# Patient Record
Sex: Female | Born: 1955 | Race: White | Hispanic: No | Marital: Married | Smoking: Never smoker
Health system: Southern US, Community
[De-identification: ages and names within clinical notes are randomized; demographics above are authoritative.]

## PROBLEM LIST (undated history)

## (undated) DIAGNOSIS — E785 Hyperlipidemia, unspecified: Secondary | ICD-10-CM

## (undated) DIAGNOSIS — T753XXA Motion sickness, initial encounter: Secondary | ICD-10-CM

## (undated) DIAGNOSIS — M1990 Unspecified osteoarthritis, unspecified site: Secondary | ICD-10-CM

## (undated) DIAGNOSIS — C801 Malignant (primary) neoplasm, unspecified: Secondary | ICD-10-CM

## (undated) DIAGNOSIS — Z973 Presence of spectacles and contact lenses: Secondary | ICD-10-CM

## (undated) HISTORY — PX: CHOLECYSTECTOMY: SHX55

## (undated) HISTORY — DX: Malignant (primary) neoplasm, unspecified: C80.1

## (undated) HISTORY — PX: BREAST LUMPECTOMY: SHX2

## (undated) HISTORY — PX: ABDOMINAL HYSTERECTOMY: SHX81

---

## 1997-08-07 HISTORY — PX: ABDOMINAL HYSTERECTOMY: SHX81

## 1997-08-07 HISTORY — PX: CHOLECYSTECTOMY: SHX55

## 1999-08-08 HISTORY — PX: BREAST LUMPECTOMY: SHX2

## 2000-04-02 ENCOUNTER — Other Ambulatory Visit: Admission: RE | Admit: 2000-04-02 | Discharge: 2000-04-02 | Payer: Self-pay | Admitting: Radiology

## 2000-04-10 ENCOUNTER — Encounter: Admission: RE | Admit: 2000-04-10 | Discharge: 2000-04-10 | Payer: Self-pay | Admitting: *Deleted

## 2000-04-11 ENCOUNTER — Ambulatory Visit (HOSPITAL_BASED_OUTPATIENT_CLINIC_OR_DEPARTMENT_OTHER): Admission: RE | Admit: 2000-04-11 | Discharge: 2000-04-11 | Payer: Self-pay | Admitting: *Deleted

## 2000-04-25 ENCOUNTER — Encounter: Admission: RE | Admit: 2000-04-25 | Discharge: 2000-07-24 | Payer: Self-pay | Admitting: Radiation Oncology

## 2000-05-08 ENCOUNTER — Encounter: Payer: Self-pay | Admitting: Oncology

## 2000-05-08 ENCOUNTER — Ambulatory Visit (HOSPITAL_COMMUNITY): Admission: RE | Admit: 2000-05-08 | Discharge: 2000-05-08 | Payer: Self-pay | Admitting: Oncology

## 2000-05-11 ENCOUNTER — Ambulatory Visit (HOSPITAL_BASED_OUTPATIENT_CLINIC_OR_DEPARTMENT_OTHER): Admission: RE | Admit: 2000-05-11 | Discharge: 2000-05-11 | Payer: Self-pay | Admitting: *Deleted

## 2000-06-25 ENCOUNTER — Encounter: Payer: Self-pay | Admitting: Oncology

## 2000-06-25 ENCOUNTER — Ambulatory Visit (HOSPITAL_COMMUNITY): Admission: RE | Admit: 2000-06-25 | Discharge: 2000-06-25 | Payer: Self-pay | Admitting: Oncology

## 2001-06-28 ENCOUNTER — Encounter: Admission: RE | Admit: 2001-06-28 | Discharge: 2001-06-28 | Payer: Self-pay | Admitting: Oncology

## 2001-06-28 ENCOUNTER — Encounter: Payer: Self-pay | Admitting: Oncology

## 2004-07-15 ENCOUNTER — Ambulatory Visit: Payer: Self-pay | Admitting: Podiatry

## 2004-10-05 ENCOUNTER — Ambulatory Visit: Payer: Self-pay | Admitting: Oncology

## 2005-04-11 ENCOUNTER — Ambulatory Visit: Payer: Self-pay | Admitting: Oncology

## 2005-06-15 ENCOUNTER — Ambulatory Visit: Payer: Self-pay | Admitting: Unknown Physician Specialty

## 2007-08-24 ENCOUNTER — Ambulatory Visit: Payer: Self-pay

## 2009-10-06 ENCOUNTER — Emergency Department: Payer: Self-pay | Admitting: Emergency Medicine

## 2009-11-25 ENCOUNTER — Ambulatory Visit: Payer: Self-pay | Admitting: Internal Medicine

## 2010-08-28 ENCOUNTER — Encounter: Payer: Self-pay | Admitting: Oncology

## 2010-12-12 ENCOUNTER — Ambulatory Visit (HOSPITAL_COMMUNITY): Admission: RE | Admit: 2010-12-12 | Payer: Self-pay | Source: Ambulatory Visit | Admitting: Obstetrics & Gynecology

## 2012-08-07 HISTORY — PX: COLONOSCOPY: SHX174

## 2012-08-14 ENCOUNTER — Other Ambulatory Visit: Payer: Self-pay | Admitting: Unknown Physician Specialty

## 2012-08-14 LAB — CLOSTRIDIUM DIFFICILE BY PCR

## 2012-08-16 LAB — STOOL CULTURE

## 2012-08-23 ENCOUNTER — Ambulatory Visit: Payer: Self-pay | Admitting: Unknown Physician Specialty

## 2012-08-27 LAB — PATHOLOGY REPORT

## 2012-12-26 DIAGNOSIS — C4491 Basal cell carcinoma of skin, unspecified: Secondary | ICD-10-CM

## 2012-12-26 HISTORY — DX: Basal cell carcinoma of skin, unspecified: C44.91

## 2013-02-27 LAB — PROTIME-INR
INR: 1
Prothrombin Time: 13 secs (ref 11.5–14.7)

## 2013-02-27 LAB — URINALYSIS, COMPLETE
Blood: NEGATIVE
Leukocyte Esterase: NEGATIVE
RBC,UR: NONE SEEN /HPF (ref 0–5)
Specific Gravity: 1.002 (ref 1.003–1.030)
Squamous Epithelial: NONE SEEN
WBC UR: NONE SEEN /HPF (ref 0–5)

## 2013-02-27 LAB — COMPREHENSIVE METABOLIC PANEL
Alkaline Phosphatase: 121 U/L (ref 50–136)
BUN: 13 mg/dL (ref 7–18)
Bilirubin,Total: 0.3 mg/dL (ref 0.2–1.0)
Calcium, Total: 9.6 mg/dL (ref 8.5–10.1)
Chloride: 100 mmol/L (ref 98–107)
Co2: 32 mmol/L (ref 21–32)
EGFR (Non-African Amer.): >60
Glucose: 96 mg/dL (ref 65–99)
SGOT(AST): 37 U/L (ref 15–37)
SGPT (ALT): 31 U/L (ref 12–78)
Sodium: 137 mmol/L (ref 136–145)

## 2013-02-27 LAB — CBC
HCT: 39.7 % (ref 35.0–47.0)
MCH: 29.8 pg (ref 26.0–34.0)
MCHC: 33.6 g/dL (ref 32.0–36.0)
MCV: 89 fL (ref 80–100)
Platelet: 241 10*3/uL (ref 150–440)
RBC: 4.47 10*6/uL (ref 3.80–5.20)
RDW: 12.9 % (ref 11.5–14.5)
WBC: 9.5 10*3/uL (ref 3.6–11.0)

## 2013-02-27 LAB — TROPONIN I: Troponin-I: <0.02 ng/mL

## 2013-02-27 LAB — APTT: Activated PTT: 32.7 secs (ref 23.6–35.9)

## 2013-02-28 ENCOUNTER — Observation Stay: Payer: Self-pay | Admitting: Internal Medicine

## 2013-02-28 LAB — LIPID PANEL
Cholesterol: 195 mg/dL (ref 0–200)
HDL Cholesterol: 45 mg/dL (ref 40–60)
Ldl Cholesterol, Calc: 117 mg/dL — ABNORMAL HIGH (ref 0–100)

## 2013-02-28 LAB — CK TOTAL AND CKMB (NOT AT ARMC)
CK, Total: 170 U/L (ref 21–215)
CK, Total: 121 U/L (ref 21–215)
CK, Total: 120 U/L (ref 21–215)
CK-MB: 1 ng/mL (ref 0.5–3.6)
CK-MB: 0.6 ng/mL (ref 0.5–3.6)
CK-MB: 0.6 ng/mL (ref 0.5–3.6)

## 2013-02-28 LAB — TROPONIN I: Troponin-I: <0.02 ng/mL

## 2013-02-28 LAB — TSH: Thyroid Stimulating Horm: 0.525 u[IU]/mL

## 2013-03-21 ENCOUNTER — Ambulatory Visit: Payer: Self-pay | Admitting: Otolaryngology

## 2013-05-30 ENCOUNTER — Encounter (INDEPENDENT_AMBULATORY_CARE_PROVIDER_SITE_OTHER): Payer: Self-pay

## 2013-05-30 ENCOUNTER — Ambulatory Visit (INDEPENDENT_AMBULATORY_CARE_PROVIDER_SITE_OTHER): Payer: 59 | Admitting: General Surgery

## 2013-05-30 ENCOUNTER — Other Ambulatory Visit (INDEPENDENT_AMBULATORY_CARE_PROVIDER_SITE_OTHER): Payer: Self-pay | Admitting: General Surgery

## 2013-05-30 ENCOUNTER — Encounter (INDEPENDENT_AMBULATORY_CARE_PROVIDER_SITE_OTHER): Payer: Self-pay | Admitting: General Surgery

## 2013-05-30 VITALS — BP 130/76 | HR 72 | Temp 98.0°F | Resp 16 | Ht 66.5 in | Wt 212.2 lb

## 2013-05-30 DIAGNOSIS — Q849 Congenital malformation of integument, unspecified: Secondary | ICD-10-CM

## 2013-05-30 DIAGNOSIS — Z853 Personal history of malignant neoplasm of breast: Secondary | ICD-10-CM | POA: Insufficient documentation

## 2013-05-30 DIAGNOSIS — N6489 Other specified disorders of breast: Secondary | ICD-10-CM

## 2013-05-30 DIAGNOSIS — C50912 Malignant neoplasm of unspecified site of left female breast: Secondary | ICD-10-CM

## 2013-05-30 NOTE — Progress Notes (Signed)
Patient ID: Zoe Suarez, female   DOB: 10-18-1955, 57 y.o.   MRN: [id number]  Chief Complaint  Patient presents with  . New Evaluation    eval for reconstructive sx    HPI Zoe Suarez is a 57 y.o. female.   HPI  She is self-referred. She had left breast lumpectomy and radiation therapy for left breast cancer in September of 2001. She has breast asymmetry and has increasing pain at the lumpectomy site of the left breast.  She is having difficulty finding a bra that is comfortable.  Her right breast has become more pendulous and is causing her to have back pain as well. She denies any breast masses or nipple discharge. She is interested in surgery for the asymmetry and possibly right breast reduction.  Past Medical History  Diagnosis Date  . Cancer     breast    Past Surgical History  Procedure Laterality Date  . Abdominal hysterectomy    . Cholecystectomy    . Breast lumpectomy Left     History reviewed. No pertinent family history.  Social History History  Substance Use Topics  . Smoking status: Never Smoker   . Smokeless tobacco: Never Used  . Alcohol Use: No    Allergies  Allergen Reactions  . Latex Itching  . Vicodin [Hydrocodone-Acetaminophen] Other (See Comments)    Caused sever headache    Current Outpatient Prescriptions  Medication Sig Dispense Refill  . escitalopram (LEXAPRO) 10 MG tablet       . hydrochlorothiazide (HYDRODIURIL) 25 MG tablet       . omeprazole (PRILOSEC) 20 MG capsule        No current facility-administered medications for this visit.    Review of Systems Review of Systems  Constitutional:       She has lost 30 pounds by exercising this year.  Respiratory: Negative.   Cardiovascular: Negative.     Blood pressure 130/76, pulse 72, temperature 98 F (36.7 C), temperature source Temporal, resp. rate 16, height 5' 6.5" (1.689 m), weight 212 lb 3.2 oz (96.253 kg).  Physical Exam Physical Exam  Constitutional: No distress.  Obese  female  HENT:  Head: Normocephalic and atraumatic.  Neck: Neck supple.  Pulmonary/Chest:  Right breast is large and pendulous. There are no dominant masses, suspicious skin changes, or nipple discharge.  Breast demonstrates a lower outer quadrant scar that is somewhat indented. There is a definite difference with respect a to symmetry in the lower outer quadrant of the left breast versus lower outer quadrant of the right breast. There is tenderness at the incision site. Small tattoo mark is noted in the left breast.  Musculoskeletal:  No axillary or supraclavicular adenopathy  Lymphadenopathy:    She has no cervical adenopathy.    Data Reviewed Mammogram done last week demonstrates no evidence of malignancy.  Assessment    Breast asymmetry following left lumpectomy and radiation therapy for breast cancer. Also having some increasing pain from this.     Plan    I suggest referral to a plastic surgeon to discuss options for reduction mammoplasty on the right and left breast asymmetry in the left. She is in agreement with this.        Honora Searson J 05/30/2013, 2:20 PM

## 2013-05-30 NOTE — Patient Instructions (Signed)
We will arrange a consultation with the plastic surgeon, Dr. Kelly Splinter, for you.

## 2013-06-02 ENCOUNTER — Encounter (INDEPENDENT_AMBULATORY_CARE_PROVIDER_SITE_OTHER): Payer: Self-pay

## 2013-06-02 ENCOUNTER — Telehealth (INDEPENDENT_AMBULATORY_CARE_PROVIDER_SITE_OTHER): Payer: Self-pay | Admitting: *Deleted

## 2013-06-02 NOTE — Telephone Encounter (Signed)
I spoke with pt and informed her of her appt with Dr. Kelly Splinter on 06/10/13 with an arrival time of 9:30am.  I provided pt with their address and phone number.  Pt agreeable.

## 2013-09-29 ENCOUNTER — Ambulatory Visit: Payer: Self-pay | Admitting: Podiatry

## 2014-11-27 NOTE — H&P (Signed)
PATIENT NAME:  MARIELLEN, BLANEY MR#:  [id number] DATE OF BIRTH:  Sep 09, 1955  DATE OF ADMISSION:  02/27/2013  PRIMARY CARE PHYSICIAN:  Dr. Emily Filbert.   REFERRING PHYSICIAN:  Dr. Ferman Hamming.   CHIEF COMPLAINT:  Dizziness, confusion.   HISTORY OF PRESENT ILLNESS:  Mrs. Seedorf is a 59 year old obese white female with no past medical history presented to the Emergency Department with complaints of dizziness, disorientation since this morning on and off.  The patient has been experiencing these symptoms for the last two weeks to three different occasions.  The initial episode which was 2 weeks back when patient walked on a hot day experienced severe dizziness, lightheadedness.  The second episode occurred when patient did not get her meal on time.   occurred in the office while sitting in the chair, was confused.  It was witnessed by the coworker that the patient had some slurred speech, disorientation.  Did not have any weakness in any part of the body, did not have any headache.  Did not have any nausea, vomiting.  The patient has been experiencing dizziness in change in position.  About three weeks back, the patient was noted to have increased swelling in the lower extremities.  The patient was placed on Lasix with significant improvement with swelling.  However today she noted to have more swelling in the lower extremities despite taking the Lasix.  The patient has extremely vague symptoms.  The patient also states that patient needs to eat every two hours which causes her to pass out.  However, they checked the blood sugars which usually are normal.  Does not experience any chest pain associated with  palpitations.  The patient walks on a daily basis, work-related as a realtor.  Sometimes experiences some chest pain and some mild shortness of breath.  Work-up in the Emergency Department with EKG and cardiac enzymes, other lab work is completely unremarkable.   PAST MEDICAL HISTORY:  None.   PAST  SURGICAL HISTORY:  None.   ALLERGIES:   1.  VICODIN.  2.  TAPE.   HOME MEDICATIONS: 1.  Prilosec.  2.  Lasix.   SOCIAL HISTORY:  No history of smoking, drinking alcohol or using illicit drugs.  Married, lives with her husband.   FAMILY HISTORY:  Father died of massive heart attack, mother is healthy in her 30s.   REVIEW OF SYSTEMS: CONSTITUTIONAL:  Generalized weakness, gained 80 pounds of weight in the last two years.  EYES:  No blurred vision.  EARS, NOSE, THROAT:  No tinnitus.  No ear pain.  No hearing loss.  RESPIRATORY:  No cough, shortness of breath.  CARDIOVASCULAR:  No chest pain, palpitations.  Has pedal edema.  GASTROINTESTINAL:  No nausea, vomiting, abdominal pain. GENITOURINARY:  No dysuria or hematuria.  HEMATOLOGIC:  No easy bruising or bleeding.  ENDOCRINE:  No polyuria or polydipsia.  SKIN:  No rash or lesions.  MUSCULOSKELETAL:  No joint pains and aches.  NEUROLOGIC:  No weakness or numbness in any part of the body except for the slurred speech.   PHYSICAL EXAMINATION: GENERAL:  This is a well-built, well-nourished, age-appropriate female lying down in the bed, not in distress.  VITAL SIGNS:  Temperature 98, pulse 86, blood pressure 142/67, respiratory rate of 16, oxygen saturation 97% on room air.  The patient's orthostatic blood pressures are normal.  HEENT:  Head normocephalic, atraumatic.  Eyes, no scleral icterus.  Conjunctivae normal.  Pupils equal and react to light.  Mucous membranes moist.  NECK:  Supple.  No lymphadenopathy.  No JVD.  No carotid bruit.  No thyromegaly.  CHEST:  Has no focal tenderness.  LUNGS:  Bilaterally clear to auscultation.  HEART:  S1 and S2 regular.  No murmurs are heard.  No pedal edema.  Pulses 2+.  ABDOMEN:  Bowel sounds plus.  Soft, nontender, nondistended.  No hepatosplenomegaly.  Obese abdomen.  SKIN:  No rash or lesions.  MUSCULOSKELETAL:  Good range of motion in all the extremities.  LYMPHATIC:  No axillary or inguinal  lymphadenopathy.  NEUROLOGIC:  The patient is alert, oriented to place, person and time.  Cranial nerves II through XII intact.  No motor and sensory deficits.   LABORATORY DATA:  UA negative for nitrites and leukocyte esterase.  CBC is completely within normal limits.  CMP is completely within normal limits.  Cardiac enzymes are well within normal limits.    MRI of the brain without contrast:  No acute intracranial abnormality.  Coag profile is well within normal limits.   ASSESSMENT AND PLAN:  Mrs. Purdy is a 59 year old female comes to the Emergency Department with near-syncope.  1.  Near-syncope.  Differential diagnosis, arrhythmia versus transient ischemic attack.  MRI is negative.  We will obtain echocardiogram and carotid Dopplers.  Admit the patient to the telemetry bed.  If work-up is negative, the patient could be discharged home, especially if patient's symptoms are related to change in position.  Also consider getting a tilt table test if the patient continues to have these symptoms.  2.  Obesity.  Counseled with the patient regarding diet and exercise.  3.  Dizziness if she does not eat within 2 hours' time.  We will check on the patient during the hospital stay.  If the patient is developing any hypoglycemia or irregular heartbeat or could be just psychologic.   4.  Keep the patient on DVT prophylaxis with Lovenox.   TIME SPENT:  45 minutes.     ____________________________ Monica Becton, MD pv:ea D: 02/27/2013 22:13:32 ET T: 02/27/2013 22:39:52 ET JOB#: [id number]  cc: Monica Becton, MD, <Dictator> Rusty Aus, MD Monica Becton MD ELECTRONICALLY SIGNED 03/16/2013 21:38

## 2014-11-27 NOTE — Discharge Summary (Signed)
PATIENT NAME:  Zoe Suarez, Zoe Suarez MR#:  [id number] DATE OF BIRTH:  19-Aug-1955  DATE OF ADMISSION:  02/28/2013 DATE OF DISCHARGE:  02/28/2013  DISCHARGE DIAGNOSES: 1.  Altered mental status secondary to hypoglycemia versus stress reaction.  2.  Thyroid nodule.  3.  Lower extremity edema.   DISCHARGE MEDICATIONS: 1.  Prilosec 20 mg daily.  2.  Furosemide 20 mg daily.  3.  Aspirin 81 mg daily.   REASON FOR ADMISSION:  A 59 year old female presents with altered mental status.  Please see H and P for HPI, past medical history and physical exam.   HOSPITAL COURSE:  The patient was admitted.  Symptoms gradually improved.  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  Carotid Dopplers unremarkable although did show a 3.5 cm thyroid nodule which will be followed up as an outpatient.  Echocardiogram unremarkable.  Brain MRI did not show an acute ischemia.  She does not eat on a consistent basis, thus hypoglycemia is possible.  This will be followed up as an outpatient.  No evidence for seizure disorder and I doubt any vascular event, but nonetheless, we will place her on 81 mg aspirin at this point with follow-up with Dr. Sabra Heck next week.    ____________________________ Rusty Aus, MD mfm:ea D: 02/28/2013 17:29:27 ET T: 03/01/2013 01:05:49 ET JOB#: [id number]  cc: Rusty Aus, MD, <Dictator> Marleta Lapierre Roselee Culver MD ELECTRONICALLY SIGNED 03/01/2013 9:07

## 2014-12-02 DIAGNOSIS — E041 Nontoxic single thyroid nodule: Secondary | ICD-10-CM | POA: Insufficient documentation

## 2014-12-02 DIAGNOSIS — E782 Mixed hyperlipidemia: Secondary | ICD-10-CM | POA: Insufficient documentation

## 2015-10-07 ENCOUNTER — Other Ambulatory Visit: Payer: Self-pay | Admitting: Physician Assistant

## 2015-10-07 DIAGNOSIS — R1310 Dysphagia, unspecified: Secondary | ICD-10-CM

## 2015-10-13 ENCOUNTER — Ambulatory Visit
Admission: RE | Admit: 2015-10-13 | Discharge: 2015-10-13 | Disposition: A | Discharge status: Home / Self Care | Payer: 59 | Source: Ambulatory Visit | Attending: Physician Assistant | Admitting: Physician Assistant

## 2015-10-13 DIAGNOSIS — R1310 Dysphagia, unspecified: Secondary | ICD-10-CM

## 2015-10-13 DIAGNOSIS — K449 Diaphragmatic hernia without obstruction or gangrene: Secondary | ICD-10-CM | POA: Diagnosis not present

## 2015-10-13 DIAGNOSIS — R1319 Other dysphagia: Secondary | ICD-10-CM | POA: Diagnosis present

## 2015-10-13 DIAGNOSIS — K219 Gastro-esophageal reflux disease without esophagitis: Secondary | ICD-10-CM | POA: Diagnosis not present

## 2016-09-19 DIAGNOSIS — K2100 Gastro-esophageal reflux disease with esophagitis, without bleeding: Secondary | ICD-10-CM | POA: Insufficient documentation

## 2016-10-04 ENCOUNTER — Other Ambulatory Visit (HOSPITAL_COMMUNITY): Payer: Self-pay | Admitting: General Surgery

## 2016-10-13 ENCOUNTER — Ambulatory Visit
Admission: RE | Admit: 2016-10-13 | Discharge: 2016-10-13 | Disposition: A | Discharge status: Home / Self Care | Payer: BLUE CROSS/BLUE SHIELD | Source: Ambulatory Visit | Attending: General Surgery | Admitting: General Surgery

## 2016-10-13 DIAGNOSIS — K449 Diaphragmatic hernia without obstruction or gangrene: Secondary | ICD-10-CM | POA: Insufficient documentation

## 2016-10-13 DIAGNOSIS — Z01818 Encounter for other preprocedural examination: Secondary | ICD-10-CM | POA: Diagnosis not present

## 2016-10-13 DIAGNOSIS — K219 Gastro-esophageal reflux disease without esophagitis: Secondary | ICD-10-CM | POA: Diagnosis not present

## 2016-11-21 ENCOUNTER — Encounter: Payer: BLUE CROSS/BLUE SHIELD | Attending: General Surgery | Admitting: Registered"

## 2016-11-21 ENCOUNTER — Encounter: Payer: Self-pay | Admitting: Registered"

## 2016-11-21 DIAGNOSIS — E669 Obesity, unspecified: Secondary | ICD-10-CM

## 2016-11-21 DIAGNOSIS — Z6841 Body Mass Index (BMI) 40.0 and over, adult: Secondary | ICD-10-CM | POA: Diagnosis not present

## 2016-11-21 NOTE — Patient Instructions (Addendum)
-   Practice CHEWING your food  (aim for 30 chews per bite or until applesauce consistency).  - Aim for 64-100 ounces of FLUID daily.    
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
## 2016-12-18 ENCOUNTER — Ambulatory Visit: Payer: BLUE CROSS/BLUE SHIELD

## 2017-01-22 ENCOUNTER — Ambulatory Visit: Payer: BLUE CROSS/BLUE SHIELD

## 2017-02-13 ENCOUNTER — Inpatient Hospital Stay: Admit: 2017-02-13 | Payer: BLUE CROSS/BLUE SHIELD | Admitting: General Surgery

## 2017-02-13 SURGERY — LAPAROSCOPIC ROUX-EN-Y GASTRIC BYPASS WITH UPPER ENDOSCOPY
Anesthesia: General

## 2017-02-27 ENCOUNTER — Ambulatory Visit: Payer: BLUE CROSS/BLUE SHIELD

## 2017-04-05 ENCOUNTER — Encounter (INDEPENDENT_AMBULATORY_CARE_PROVIDER_SITE_OTHER): Payer: Self-pay

## 2018-06-14 ENCOUNTER — Ambulatory Visit: Payer: Self-pay | Admitting: Podiatry

## 2018-06-28 ENCOUNTER — Encounter: Payer: Self-pay | Admitting: Podiatry

## 2018-06-28 ENCOUNTER — Encounter

## 2018-06-28 ENCOUNTER — Other Ambulatory Visit: Payer: Self-pay | Admitting: Podiatry

## 2018-06-28 ENCOUNTER — Ambulatory Visit (INDEPENDENT_AMBULATORY_CARE_PROVIDER_SITE_OTHER): Payer: BLUE CROSS/BLUE SHIELD

## 2018-06-28 ENCOUNTER — Ambulatory Visit (INDEPENDENT_AMBULATORY_CARE_PROVIDER_SITE_OTHER): Payer: BLUE CROSS/BLUE SHIELD | Admitting: Podiatry

## 2018-06-28 VITALS — BP 118/79 | HR 95 | Temp 98.2°F

## 2018-06-28 DIAGNOSIS — M2041 Other hammer toe(s) (acquired), right foot: Secondary | ICD-10-CM

## 2018-06-28 DIAGNOSIS — M722 Plantar fascial fibromatosis: Secondary | ICD-10-CM

## 2018-06-28 DIAGNOSIS — M21611 Bunion of right foot: Secondary | ICD-10-CM

## 2018-06-28 NOTE — Patient Instructions (Signed)
Pre-Operative Instructions  Congratulations, you have decided to take an important step towards improving your quality of life.  You can be assured that the doctors and staff at [facility] will be with you every step of the way.  Here are some important things you should know:  1. Plan to be at the surgery center/hospital at least 1 (one) hour prior to your scheduled time, unless otherwise directed by the surgical center/hospital staff.  You must have a responsible adult accompany you, remain during the surgery and drive you home.  Make sure you have directions to the surgical center/hospital to ensure you arrive on time. 2. If you are having surgery at [facility] or [facility], you will need a copy of your medical history and physical form from your family physician within one month prior to the date of surgery. We will give you a form for your primary physician to complete.  3. We make every effort to accommodate the date you request for surgery.  However, there are times where surgery dates or times have to be moved.  We will contact you as soon as possible if a change in schedule is required.   4. No aspirin/ibuprofen for one week before surgery.  If you are on aspirin, any non-steroidal anti-inflammatory medications (Mobic, Aleve, Ibuprofen) should not be taken seven (7) days prior to your surgery.  You make take Tylenol for pain prior to surgery.  5. Medications - If you are taking daily heart and blood pressure medications, seizure, reflux, allergy, asthma, anxiety, pain or diabetes medications, make sure you notify the surgery center/hospital before the day of surgery so they can tell you which medications you should take or avoid the day of surgery. 6. No food or drink after midnight the night before surgery unless directed otherwise by surgical center/hospital staff. 7. No alcoholic beverages 24-hours prior to surgery.  No smoking 24-hours prior or 24-hours after  surgery. 8. Wear loose pants or shorts. They should be loose enough to fit over bandages, boots, and casts. 9. Don't wear slip-on shoes. Sneakers are preferred. 10. Bring your boot with you to the surgery center/hospital.  Also bring crutches or a walker if your physician has prescribed it for you.  If you do not have this equipment, it will be provided for you after surgery. 11. If you have not been contacted by the surgery center/hospital by the day before your surgery, call to confirm the date and time of your surgery. 12. Leave-time from work may vary depending on the type of surgery you have.  Appropriate arrangements should be made prior to surgery with your employer. 13. Prescriptions will be provided immediately following surgery by your doctor.  Fill these as soon as possible after surgery and take the medication as directed. Pain medications will not be refilled on weekends and must be approved by the doctor. 14. Remove nail polish on the operative foot and avoid getting pedicures prior to surgery. 15. Wash the night before surgery.  The night before surgery wash the foot and leg well with water and the antibacterial soap provided. Be sure to pay special attention to beneath the toenails and in between the toes.  Wash for at least three (3) minutes. Rinse thoroughly with water and dry well with a towel.  Perform this wash unless told not to do so by your physician.  Enclosed: 1 Ice pack (please put in freezer the night before surgery)   1 Hibiclens skin cleaner     Pre-op instructions  If you have any questions regarding the instructions, please do not hesitate to call our office.  [place]: [address], [place], [place] [address] -- [phone]  [place]: [address] [place], [place] [address] -- [phone]  [place]: [address]  [employer], [place] [address] -- [phone]  [place]: [address], [address], [place], [place] [address] -- [phone]  Website:  [email or web address] 

## 2018-07-01 NOTE — Progress Notes (Signed)
   Subjective: 62 year old female presenting today as a new patient with a chief complaint of intermittent aching of the right hallux that began over one year ago. Walking and standing increases the pain. She has been taking Aleve with some temporary relief of the symptoms. Patient is here for further evaluation and treatment.   Past Medical History:  Diagnosis Date  . [facility])    breast     Objective: Physical Exam General: The patient is alert and oriented x3 in no acute distress.  Dermatology: Skin is cool, dry and supple bilateral lower extremities. Negative for open lesions or macerations.  Vascular: Palpable pedal pulses bilaterally. No edema or erythema noted. Capillary refill within normal limits.  Neurological: Epicritic and protective threshold grossly intact bilaterally.   Musculoskeletal Exam: Clinical evidence of bunion deformity noted to the respective foot. There is a moderate pain on palpation range of motion of the first MPJ. Lateral deviation of the hallux noted consistent with hallux abductovalgus. Hammertoe contracture also noted on clinical exam to the 2nd digit of the right foot. Symptomatic pain on palpation and range of motion also noted to the metatarsal phalangeal joints of the respective hammertoe digits.    Radiographic Exam: Increased intermetatarsal angle greater than 15 with a hallux abductus angle greater than 30 noted on AP view. Moderate degenerative changes noted within the first MPJ. Contracture deformity also noted to the interphalangeal joints and MPJs of the digits of the respective hammertoes.    Assessment: 1. HAV w/ bunion deformity right 2. Hammertoe deformity 2nd digit right    Plan of Care:  1. Patient was evaluated. X-Rays reviewed. 2. Today we discussed the conservative versus surgical management of the presenting pathology. The patient opts for surgical management. All possible complications and details of the procedure were  explained. All patient questions were answered. No guarantees were expressed or implied. 3. Authorization for surgery was initiated today. Surgery will consist of bunionectomy with metatarsal osteotomy right; DIPJ arthroplasty with MPJ capsulotomy right.  4. Return to clinic one week post op.    Edrick Kins, DPM [facility]  Dr. Edrick Kins, [place]                                        [place], [place] [address]                Office [phone]  Fax [phone]

## 2018-07-08 ENCOUNTER — Telehealth: Payer: Self-pay | Admitting: *Deleted

## 2018-07-08 NOTE — Telephone Encounter (Signed)
"  I went see Dr. Daylene Katayama about a week ago and he wants to schedule surgery in February sometime.  Could you please give me a call back?"

## 2018-07-09 NOTE — Telephone Encounter (Signed)
I'm returning your calls.  How can I help you.  "I'd like to schedule my surgery with Dr. Amalia Hailey in February."  Do you have a date in mind?  "Let's schedule it for February 13."  I will get it scheduled.  Someone from the surgical center will give you a call and give you the arrival time.  They normally call a day or two prior to your surgical date.  You need to go online and register with the surgical center, instructions are in the brochure that we gave you.  "Okay, thank you very much."

## 2018-08-14 ENCOUNTER — Telehealth: Payer: Self-pay | Admitting: *Deleted

## 2018-08-14 NOTE — Telephone Encounter (Signed)
"  Zoe Suarez said she wanted to cancel her surgery."  What date is she scheduled?  "I think February 13."  Did she say why?  "She did not but she requested her medical records so I think she's going somewhere else."  I will cancel it.

## 2018-09-19 ENCOUNTER — Ambulatory Visit: Admit: 2018-09-19 | Payer: BLUE CROSS/BLUE SHIELD | Admitting: Podiatry

## 2018-09-19 SURGERY — BUNIONECTOMY, LAPIDUS
Anesthesia: Choice | Laterality: Right

## 2018-09-20 ENCOUNTER — Emergency Department
Admission: EM | Admit: 2018-09-20 | Discharge: 2018-09-20 | Disposition: A | Discharge status: Home / Self Care | Payer: BLUE CROSS/BLUE SHIELD | Attending: Emergency Medicine | Admitting: Emergency Medicine

## 2018-09-20 ENCOUNTER — Emergency Department: Payer: BLUE CROSS/BLUE SHIELD

## 2018-09-20 ENCOUNTER — Encounter: Payer: Self-pay | Admitting: Emergency Medicine

## 2018-09-20 DIAGNOSIS — Z79899 Other long term (current) drug therapy: Secondary | ICD-10-CM | POA: Diagnosis not present

## 2018-09-20 DIAGNOSIS — R200 Anesthesia of skin: Secondary | ICD-10-CM | POA: Insufficient documentation

## 2018-09-20 DIAGNOSIS — R079 Chest pain, unspecified: Secondary | ICD-10-CM | POA: Insufficient documentation

## 2018-09-20 DIAGNOSIS — Z853 Personal history of malignant neoplasm of breast: Secondary | ICD-10-CM | POA: Diagnosis not present

## 2018-09-20 LAB — BASIC METABOLIC PANEL
ANION GAP: 6 (ref 5–15)
BUN: 15 mg/dL (ref 8–23)
CHLORIDE: 101 mmol/L (ref 98–111)
CO2: 33 mmol/L — AB (ref 22–32)
CREATININE: 0.57 mg/dL (ref 0.44–1.00)
Calcium: 9.8 mg/dL (ref 8.9–10.3)
GFR calc non Af Amer: >60 mL/min (ref >60)
Glucose, Bld: 72 mg/dL (ref 70–99)
POTASSIUM: 3 mmol/L — AB (ref 3.5–5.1)
SODIUM: 140 mmol/L (ref 135–145)

## 2018-09-20 LAB — CBC
HEMATOCRIT: 42.7 % (ref 36.0–46.0)
Hemoglobin: 13.8 g/dL (ref 12.0–15.0)
MCH: 29.9 pg (ref 26.0–34.0)
MCHC: 32.3 g/dL (ref 30.0–36.0)
MCV: 92.4 fL (ref 80.0–100.0)
NRBC: 0 % (ref 0.0–0.2)
Platelets: 248 10*3/uL (ref 150–400)
RBC: 4.62 MIL/uL (ref 3.87–5.11)
RDW: 13.4 % (ref 11.5–15.5)
WBC: 7.2 10*3/uL (ref 4.0–10.5)

## 2018-09-20 LAB — TROPONIN I: Troponin I: <0.03 ng/mL (ref <0.03)

## 2018-09-20 MED ORDER — SODIUM CHLORIDE 0.9% FLUSH
3.0000 mL | Freq: Once | INTRAVENOUS | Status: AC
  Administered 2018-09-20: 3 mL via INTRAVENOUS

## 2018-09-20 NOTE — ED Notes (Signed)
Patient transported to MRI 

## 2018-09-20 NOTE — ED Triage Notes (Signed)
Pt reports having chest tightness that began this am, states that she is also having some pain into left jaw. Under lots of stress recently. Pt does not appear to be in any obvious distress. Denies sob.

## 2018-09-20 NOTE — ED Provider Notes (Signed)
[facility] Emergency Department Provider Note   ____________________________________________   First MD Initiated Contact with Patient 09/20/18 1110     (approximate)  I have reviewed the triage vital signs and the nursing notes.   HISTORY  Chief Complaint Chest Pain    HPI Zoe Suarez is a 63 y.o. female who reports she has been under a lot of stress lately but woke up this morning at 530 and had a tight band across her chest.  She also had some numbness in the left side of her face.  She said her teeth felt funny yesterday.  Tight band lasted for about 3 hours until EMS got there.  She tried aspirin and Tums and some soda water also.  None of that helped.  Her husband reports she got very woozy and lightheaded and had to be laid down for little bit.  She almost passed out.  She is not had any of this before.  Past Medical History:  Diagnosis Date  . Cancer [facility])    breast    Patient Active Problem List   Diagnosis Date Noted  . Esophagitis, reflux 09/19/2016  . Hyperlipidemia, mixed 12/02/2014  . Thyroid nodule 12/02/2014  . Breast asymmetry 05/30/2013  . Hx of breast cancer 05/30/2013    Past Surgical History:  Procedure Laterality Date  . ABDOMINAL HYSTERECTOMY    . BREAST LUMPECTOMY Left   . CHOLECYSTECTOMY      Prior to Admission medications   Medication Sig Start Date End Date Taking? Authorizing Provider  Bioflavonoid Products (ESTER-C) [phone] MG TABS Take by mouth.   Yes [provider]  cetirizine (ZYRTEC) 10 MG tablet Take 10 mg by mouth daily.   Yes [provider]  hydrochlorothiazide (HYDRODIURIL) 25 MG tablet Take 12.5 mg by mouth daily.  03/06/13  Yes [provider]  Magnesium 400 MG CAPS Take 400 mg by mouth daily.    Yes [provider]  Multiple Vitamin (MULTIVITAMIN) tablet Take 1 tablet by mouth daily.   Yes [provider]  Omega-3 Fatty Acids (FISH OIL) 1200 MG CAPS  Take 1,200 mg by mouth daily.    Yes [provider]  omeprazole (PRILOSEC) 20 MG capsule  03/31/13  Yes [provider]  Potassium 99 MG TABS Take 99 mg by mouth daily.    Yes [provider]  vitamin B-12 (CYANOCOBALAMIN) 500 MCG tablet Take 500 mcg by mouth 2 (two) times daily.   Yes [provider]  vitamin E 400 UNIT capsule Take 400 Units by mouth daily.   Yes [provider]    Allergies Codeine; Latex; and Vicodin [hydrocodone-acetaminophen]  Family History  Problem Relation Age of Onset  . Cancer Other     Social History Social History   Tobacco Use  . Smoking status: Never Smoker  . Smokeless tobacco: Never Used  Substance Use Topics  . Alcohol use: No  . Drug use: No    Review of Systems  Constitutional: No fever/chills Eyes: No visual changes. ENT: No sore throat. Cardiovascular: Denies chest pain at present. Respiratory: Denies shortness of breath. Gastrointestinal: No abdominal pain.  No nausea, no vomiting.  No diarrhea.  No constipation. Genitourinary: Negative for dysuria. Musculoskeletal: Negative for back pain. Skin: Negative for rash. Neurological: Negative for headaches, focal weakness   ____________________________________________   PHYSICAL EXAM:  VITAL SIGNS: ED Triage Vitals [09/20/18 0933]  Enc Vitals Group     BP (!) 140/98  Pulse Rate 78     Resp 16     Temp 98.1 F (36.7 C)     Temp Source Oral     SpO2 100 %     Weight 240 lb (108.9 kg)     Height 5\' 7"  (1.702 m)     Head Circumference      Peak Flow      Pain Score 2     Pain Loc      Pain Edu?      Excl. in [place]?     Constitutional: Alert and oriented. Well appearing and in no acute distress. Eyes: Conjunctivae are normal.  Head: Atraumatic. Nose: No congestion/rhinnorhea. Mouth/Throat: Mucous membranes are moist.  Oropharynx non-erythematous. Neck: No stridor.   Cardiovascular: Normal rate, regular rhythm. Grossly normal  heart sounds.  Good peripheral circulation. Respiratory: Normal respiratory effort.  No retractions. Lungs CTAB. Gastrointestinal: Soft and nontender. No distention. No abdominal bruits. No CVA tenderness. Musculoskeletal: No lower extremity tenderness trace edema.  Neurologic:  Normal speech and language. No gross focal neurologic deficits are appreciated.    Skin:  Skin is warm, dry and intact. No rash noted. Psychiatric: Mood and affect are normal. Speech and behavior are normal.  ____________________________________________   LABS (all labs ordered are listed, but only abnormal results are displayed)  Labs Reviewed  BASIC METABOLIC PANEL - Abnormal; Notable for the following components:      Result Value   Potassium 3.0 (*)    CO2 33 (*)    All other components within normal limits  CBC  TROPONIN I  TROPONIN I   ____________________________________________  EKG  KG read interpreted by me shows normal sinus rhythm rate of 83 normal axis essentially normal EKG ____________________________________________  RADIOLOGY  ED MD interpretation: X-ray read by radiology reviewed by me shows no active disease  MRI of the head read by radiology is normal.  This was done for the face numbness that she is having.  Official radiology report(s): Dg Chest 2 View  Result Date: 09/20/2018 CLINICAL DATA:  Chest pain beginning today. Extends to the jaw and left arm. EXAM: CHEST - 2 VIEW COMPARISON:  10/13/2016 FINDINGS: Heart size is normal. Mediastinal shadows are normal. The vascularity is normal. Lungs are clear. No effusions. No abnormal bone finding. IMPRESSION: No active cardiopulmonary disease. Electronically Signed   By: Nelson Chimes M.D.   On: 09/20/2018 10:25   Mr Brain Wo Contrast  Result Date: 09/20/2018 CLINICAL DATA:  63 year old female with face numbness today. Chest tightness, pain radiating to the left jaw. EXAM: MRI HEAD WITHOUT CONTRAST TECHNIQUE: Multiplanar, multiecho  pulse sequences of the brain and surrounding structures were obtained without intravenous contrast. COMPARISON:  Brain MRI 02/27/2013. FINDINGS: Brain: No restricted diffusion to suggest acute infarction. No midline shift, mass effect, evidence of mass lesion, ventriculomegaly, extra-axial collection or acute intracranial hemorrhage. Cervicomedullary junction and pituitary are within normal limits. Pearline Cables and white matter signal is within normal limits for age throughout the brain. No chronic cerebral blood products. Vascular: Major intracranial vascular flow voids appear stable. Skull and upper cervical spine: Negative visible cervical spine. Normal bone marrow signal. Sinuses/Orbits: Stable and negative. Other: Mastoids remain clear. Scalp and face soft tissues appear negative. IMPRESSION: Normal for age noncontrast MRI appearance of the brain. Electronically Signed   By: Genevie Ann M.D.   On: 09/20/2018 12:31    ____________________________________________   PROCEDURES  Procedure(s) performed:   Procedures  Critical Care performed:   ____________________________________________  INITIAL IMPRESSION / ASSESSMENT AND PLAN / ED COURSE Second troponin is negative, EKGs are okay.  I discussed the patient in depth with Dr. Nehemiah Massed.  He is okay with letting her go as she refuses to stay in the hospital.  He will get [place] a follow-up appointment for her.  She will return if she has any further problems.          ____________________________________________   FINAL CLINICAL IMPRESSION(S) / ED DIAGNOSES  Final diagnoses:  Nonspecific chest pain     ED Discharge Orders    None       Note:  This document was prepared using Dragon voice recognition software and may include unintentional dictation errors.    Nena Polio, MD 09/20/18 [phone]

## 2018-09-20 NOTE — Discharge Instructions (Addendum)
Not sure what happened to cause her chest pain.  The EKG chest x-ray and heart blood work here today are normal.  I am still worried about what happened with you though.  Please be careful take it easy for the rest of the weekend.  Make sure you are taking full aspirin once a day.  See Dr. Nehemiah Massed the cardiologist on Monday.  They are to call shortly with an appointment time.  Return at once for any further chest tightness or episodes of wooziness.

## 2018-09-20 NOTE — ED Notes (Signed)
Sent rainbow to lab. 

## 2019-03-17 ENCOUNTER — Other Ambulatory Visit: Payer: BLUE CROSS/BLUE SHIELD

## 2019-05-12 ENCOUNTER — Other Ambulatory Visit: Payer: BLUE CROSS/BLUE SHIELD

## 2019-06-18 ENCOUNTER — Other Ambulatory Visit: Payer: Self-pay

## 2019-06-18 ENCOUNTER — Other Ambulatory Visit: Payer: Self-pay | Admitting: Podiatry

## 2019-06-18 ENCOUNTER — Encounter: Payer: Self-pay | Admitting: *Deleted

## 2019-06-23 ENCOUNTER — Other Ambulatory Visit
Admission: RE | Admit: 2019-06-23 | Discharge: 2019-06-23 | Disposition: A | Discharge status: Home / Self Care | Payer: BC Managed Care – PPO | Source: Ambulatory Visit | Attending: Podiatry | Admitting: Podiatry

## 2019-06-23 DIAGNOSIS — Z01812 Encounter for preprocedural laboratory examination: Secondary | ICD-10-CM | POA: Insufficient documentation

## 2019-06-23 DIAGNOSIS — Z20828 Contact with and (suspected) exposure to other viral communicable diseases: Secondary | ICD-10-CM | POA: Insufficient documentation

## 2019-06-23 LAB — SARS CORONAVIRUS 2 (TAT 6-24 HRS): SARS Coronavirus 2: NEGATIVE

## 2019-06-23 NOTE — Discharge Instructions (Signed)
[facility] °[facility] ° °POST OPERATIVE INSTRUCTIONS FOR DR. TROXLER, DR. FOWLER, AND DR. BAKER °[facility] ° ° °1. Take your medication as prescribed.  Pain medication should be taken only as needed. ° °2. Keep the dressing clean, dry and intact. ° °3. Keep your foot elevated above the heart level for the first 48 hours. ° °4. Walking to the bathroom and brief periods of walking are acceptable, unless we have instructed you to be non-weight bearing. ° °5. Always wear your post-op shoe when walking.  Always use your crutches if you are to be non-weight bearing. ° °6. Do not take a shower. Baths are permissible as long as the foot is kept out of the water.  ° °7. Every hour you are awake:  °- Bend your knee 15 times. °- Flex foot 15 times °- Massage calf 15 times ° °8. Call [facility] ([phone]) if any of the following problems occur: °- You develop a temperature or fever. °- The bandage becomes saturated with blood. °- Medication does not stop your pain. °- Injury of the foot occurs. °- Any symptoms of infection including redness, odor, or red streaks running from wound. ° °General Anesthesia, Adult, Care After °This sheet gives you information about how to care for yourself after your procedure. Your health care provider may also give you more specific instructions. If you have problems or questions, contact your health care provider. °What can I expect after the procedure? °After the procedure, the following side effects are common: °· Pain or discomfort at the IV site. °· Nausea. °· Vomiting. °· Sore throat. °· Trouble concentrating. °· Feeling cold or chills. °· Weak or tired. °· Sleepiness and fatigue. °· Soreness and body aches. These side effects can affect parts of the body that were not involved in surgery. °Follow these instructions at home: ° °For at least 24 hours after the procedure: °· Have a responsible adult stay with you. It is  important to have someone help care for you until you are awake and alert. °· Rest as needed. °· Do not: °? Participate in activities in which you could fall or become injured. °? Drive. °? Use heavy machinery. °? Drink alcohol. °? Take sleeping pills or medicines that cause drowsiness. °? Make important decisions or sign legal documents. °? Take care of children on your own. °Eating and drinking °· Follow any instructions from your health care provider about eating or drinking restrictions. °· When you feel hungry, start by eating small amounts of foods that are soft and easy to digest (bland), such as toast. Gradually return to your regular diet. °· Drink enough fluid to keep your urine pale yellow. °· If you vomit, rehydrate by drinking water, juice, or clear broth. °General instructions °· If you have sleep apnea, surgery and certain medicines can increase your risk for breathing problems. Follow instructions from your health care provider about wearing your sleep device: °? Anytime you are sleeping, including during daytime naps. °? While taking prescription pain medicines, sleeping medicines, or medicines that make you drowsy. °· Return to your normal activities as told by your health care provider. Ask your health care provider what activities are safe for you. °· Take over-the-counter and prescription medicines only as told by your health care provider. °· If you smoke, do not smoke without supervision. °· Keep all follow-up visits as told by your health care provider. This is important. °Contact a health care provider if: °· You   have nausea or vomiting that does not get better with medicine. °· You cannot eat or drink without vomiting. °· You have pain that does not get better with medicine. °· You are unable to pass urine. °· You develop a skin rash. °· You have a fever. °· You have redness around your IV site that gets worse. °Get help right away if: °· You have difficulty breathing. °· You have chest  pain. °· You have blood in your urine or stool, or you vomit blood. °Summary °· After the procedure, it is common to have a sore throat or nausea. It is also common to feel tired. °· Have a responsible adult stay with you for the first 24 hours after general anesthesia. It is important to have someone help care for you until you are awake and alert. °· When you feel hungry, start by eating small amounts of foods that are soft and easy to digest (bland), such as toast. Gradually return to your regular diet. °· Drink enough fluid to keep your urine pale yellow. °· Return to your normal activities as told by your health care provider. Ask your health care provider what activities are safe for you. °This information is not intended to replace advice given to you by your health care provider. Make sure you discuss any questions you have with your health care provider. °Document Released: 10/30/2000 Document Revised: 07/27/2017 Document Reviewed: 03/09/2017 °Elsevier Patient Education © 2020 [employer] ° °

## 2019-06-23 NOTE — Anesthesia Preprocedure Evaluation (Addendum)
Anesthesia Evaluation  Patient identified by MRN, date of birth, ID band Patient awake    Reviewed: Allergy & Precautions, NPO status , Patient's Chart, lab work & pertinent test results  History of Anesthesia Complications Negative for: history of anesthetic complications  Airway Mallampati: II  TM Distance: >3 FB Neck ROM: Full    Dental   Pulmonary    breath sounds clear to auscultation       Cardiovascular (-) angina(-) DOE  Rhythm:Regular Rate:Normal   HLD  Single episode of chest tightness in 09/2018. Troponins & EKG negative @ ED. Cardiology consult recommended stress test, but has not been completed   Neuro/Psych  Motion sickness    GI/Hepatic GERD  Controlled,  Endo/Other    Renal/GU      Musculoskeletal  (+) Arthritis ,   Abdominal (+) + obese (BMI 33),   Peds  Hematology   Anesthesia Other Findings Breast cancer  Reproductive/Obstetrics                            Anesthesia Physical Anesthesia Plan  ASA: II  Anesthesia Plan: General   Post-op Pain Management:  Regional for Post-op pain   Induction: Intravenous  PONV Risk Score and Plan: 3 and Ondansetron, Dexamethasone, Treatment may vary due to age or medical condition and Midazolam  Airway Management Planned: LMA  Additional Equipment:   Intra-op Plan:   Post-operative Plan: Extubation in OR  Informed Consent: I have reviewed the patients History and Physical, chart, labs and discussed the procedure including the risks, benefits and alternatives for the proposed anesthesia with the patient or authorized representative who has indicated his/her understanding and acceptance.       Plan Discussed with: CRNA and Anesthesiologist  Anesthesia Plan Comments:        Anesthesia Quick Evaluation

## 2019-06-26 ENCOUNTER — Ambulatory Visit: Payer: BC Managed Care – PPO | Admitting: Anesthesiology

## 2019-06-26 ENCOUNTER — Ambulatory Visit
Admission: RE | Admit: 2019-06-26 | Discharge: 2019-06-26 | Disposition: A | Discharge status: Home / Self Care | Payer: BC Managed Care – PPO | Attending: Podiatry | Admitting: Podiatry

## 2019-06-26 ENCOUNTER — Encounter
Admission: RE | Disposition: A | Discharge status: Home / Self Care | Payer: Self-pay | Source: Home / Self Care | Attending: Podiatry

## 2019-06-26 ENCOUNTER — Other Ambulatory Visit: Payer: Self-pay

## 2019-06-26 DIAGNOSIS — G8929 Other chronic pain: Secondary | ICD-10-CM | POA: Insufficient documentation

## 2019-06-26 DIAGNOSIS — Z79899 Other long term (current) drug therapy: Secondary | ICD-10-CM | POA: Diagnosis not present

## 2019-06-26 DIAGNOSIS — Z91040 Latex allergy status: Secondary | ICD-10-CM | POA: Diagnosis not present

## 2019-06-26 DIAGNOSIS — M2011 Hallux valgus (acquired), right foot: Secondary | ICD-10-CM | POA: Diagnosis not present

## 2019-06-26 DIAGNOSIS — Z885 Allergy status to narcotic agent status: Secondary | ICD-10-CM | POA: Diagnosis not present

## 2019-06-26 DIAGNOSIS — M899 Disorder of bone, unspecified: Secondary | ICD-10-CM | POA: Diagnosis not present

## 2019-06-26 DIAGNOSIS — M205X1 Other deformities of toe(s) (acquired), right foot: Secondary | ICD-10-CM | POA: Diagnosis not present

## 2019-06-26 DIAGNOSIS — M1990 Unspecified osteoarthritis, unspecified site: Secondary | ICD-10-CM | POA: Diagnosis not present

## 2019-06-26 DIAGNOSIS — Z853 Personal history of malignant neoplasm of breast: Secondary | ICD-10-CM | POA: Diagnosis not present

## 2019-06-26 HISTORY — DX: Hyperlipidemia, unspecified: E78.5

## 2019-06-26 HISTORY — PX: FOREIGN BODY REMOVAL: SHX962

## 2019-06-26 HISTORY — DX: Presence of spectacles and contact lenses: Z97.3

## 2019-06-26 HISTORY — PX: AIKEN OSTEOTOMY: SHX6331

## 2019-06-26 HISTORY — DX: Motion sickness, initial encounter: T75.3XXA

## 2019-06-26 HISTORY — DX: Unspecified osteoarthritis, unspecified site: M19.90

## 2019-06-26 HISTORY — PX: BUNIONECTOMY: SHX129

## 2019-06-26 SURGERY — BUNIONECTOMY
Anesthesia: General | Site: Foot | Laterality: Right

## 2019-06-26 MED ORDER — OXYCODONE HCL 5 MG/5ML PO SOLN: 5.0000 mg | Freq: Once | ORAL | Status: DC | PRN

## 2019-06-26 MED ORDER — BUPIVACAINE HCL (PF) 0.25 % IJ SOLN
INTRAMUSCULAR | Status: DC | PRN
  Administered 2019-06-26: 10 mL

## 2019-06-26 MED ORDER — HYDROMORPHONE HCL 1 MG/ML IJ SOLN: 0.2500 mg | INTRAMUSCULAR | Status: DC | PRN

## 2019-06-26 MED ORDER — ROPIVACAINE HCL 5 MG/ML IJ SOLN
INTRAMUSCULAR | Status: DC | PRN
  Administered 2019-06-26: 15 mL via EPIDURAL
  Administered 2019-06-26: 25 mL via EPIDURAL

## 2019-06-26 MED ORDER — MIDAZOLAM HCL 2 MG/2ML IJ SOLN
INTRAMUSCULAR | Status: DC | PRN
  Administered 2019-06-26: 2 mg via INTRAVENOUS

## 2019-06-26 MED ORDER — PROPOFOL 10 MG/ML IV BOLUS
INTRAVENOUS | Status: DC | PRN
  Administered 2019-06-26: 150 mg via INTRAVENOUS
  Administered 2019-06-26: 50 mg via INTRAVENOUS

## 2019-06-26 MED ORDER — DEXAMETHASONE SODIUM PHOSPHATE 4 MG/ML IJ SOLN
INTRAMUSCULAR | Status: DC | PRN
  Administered 2019-06-26: 4 mg via INTRAVENOUS

## 2019-06-26 MED ORDER — ONDANSETRON HCL 4 MG/2ML IJ SOLN
INTRAMUSCULAR | Status: DC | PRN
  Administered 2019-06-26: 4 mg via INTRAVENOUS

## 2019-06-26 MED ORDER — POVIDONE-IODINE 7.5 % EX SOLN: Freq: Once | CUTANEOUS | Status: DC

## 2019-06-26 MED ORDER — PROMETHAZINE HCL 12.5 MG PO TABS: 12.5000 mg | ORAL_TABLET | Freq: Four times a day (QID) | ORAL | 0 refills | Status: AC | PRN

## 2019-06-26 MED ORDER — GLYCOPYRROLATE 0.2 MG/ML IJ SOLN
INTRAMUSCULAR | Status: DC | PRN
  Administered 2019-06-26: 0.1 mg via INTRAVENOUS

## 2019-06-26 MED ORDER — FENTANYL CITRATE (PF) 100 MCG/2ML IJ SOLN
INTRAMUSCULAR | Status: DC | PRN
  Administered 2019-06-26: 50 ug via INTRAVENOUS

## 2019-06-26 MED ORDER — LACTATED RINGERS IV SOLN
100.0000 mL/h | INTRAVENOUS | Status: DC
  Administered 2019-06-26: 100 mL/h via INTRAVENOUS

## 2019-06-26 MED ORDER — LIDOCAINE HCL (CARDIAC) PF 100 MG/5ML IV SOSY
PREFILLED_SYRINGE | INTRAVENOUS | Status: DC | PRN
  Administered 2019-06-26: 20 mg via INTRATRACHEAL

## 2019-06-26 MED ORDER — PROMETHAZINE HCL 25 MG/ML IJ SOLN: 6.2500 mg | INTRAMUSCULAR | Status: DC | PRN

## 2019-06-26 MED ORDER — OXYCODONE HCL 5 MG PO TABS: 5.0000 mg | ORAL_TABLET | Freq: Once | ORAL | Status: DC | PRN

## 2019-06-26 MED ORDER — OXYCODONE-ACETAMINOPHEN 7.5-325 MG PO TABS: 1.0000 | ORAL_TABLET | ORAL | 0 refills | Status: AC | PRN

## 2019-06-26 MED ORDER — ENOXAPARIN SODIUM 40 MG/0.4ML ~~LOC~~ SOLN: 40.0000 mg | SUBCUTANEOUS | 0 refills | Status: AC

## 2019-06-26 MED ORDER — CEFAZOLIN SODIUM-DEXTROSE 2-4 GM/100ML-% IV SOLN
2.0000 g | INTRAVENOUS | Status: AC
  Administered 2019-06-26: 2 g via INTRAVENOUS

## 2019-06-26 SURGICAL SUPPLY — 74 items
APL SKNCLS STERI-STRIP NONHPOA (GAUZE/BANDAGES/DRESSINGS) ×1
BENZOIN TINCTURE PRP APPL 2/3 (GAUZE/BANDAGES/DRESSINGS) ×2 IMPLANT
BIT DRILL 2 FENESTRATED (MISCELLANEOUS) IMPLANT
BIT DRILL CANNULTD 2.6 X 130MM (DRILL) IMPLANT
BIT DRILL SOLID 2.0 X 110MM (DRILL) IMPLANT
BIT DRILLL 2 FENESTRATED (MISCELLANEOUS) ×1
BLADE OSC/SAGITTAL MD 5.5X18 (BLADE) IMPLANT
BLADE OSC/SAGITTAL MD 9X18.5 (BLADE) IMPLANT
BLADE OSCILLATING/SAGITTAL (BLADE) ×2
BLADE SW THK.38XMED LNG THN (BLADE) IMPLANT
BNDG CMPR 75X41 PLY HI ABS (GAUZE/BANDAGES/DRESSINGS) ×1
BNDG CMPR STD VLCR NS LF 5.8X4 (GAUZE/BANDAGES/DRESSINGS) ×2
BNDG ELASTIC 4X5.8 VLCR NS LF (GAUZE/BANDAGES/DRESSINGS) ×2 IMPLANT
BNDG ESMARK 4X12 TAN STRL LF (GAUZE/BANDAGES/DRESSINGS) ×2 IMPLANT
BNDG GAUZE 4.5X4.1 6PLY STRL (MISCELLANEOUS) ×2 IMPLANT
BNDG STRETCH 4X75 STRL LF (GAUZE/BANDAGES/DRESSINGS) ×2 IMPLANT
CANISTER SUCT 1200ML W/VALVE (MISCELLANEOUS) ×2 IMPLANT
CAST PADDING 3X4FT ST 30246 (SOFTGOODS)
COUNTERSICK 4.0 HEADED (MISCELLANEOUS) ×2
COVER LIGHT HANDLE UNIVERSAL (MISCELLANEOUS) ×4 IMPLANT
COVER PIN YLW 0.028-062 (MISCELLANEOUS) IMPLANT
CUFF TOURN SGL QUICK 18X4 (TOURNIQUET CUFF) ×1 IMPLANT
DRAPE FLUOR MINI C-ARM 54X84 (DRAPES) ×2 IMPLANT
DRILL CANNULATED 2.6 X 130MM (DRILL) ×2
DRILL SOLID 2.0 X 110MM (DRILL) ×2
DURAPREP 26ML APPLICATOR (WOUND CARE) ×2 IMPLANT
ELECT REM PT RETURN 9FT ADLT (ELECTROSURGICAL) ×2
ELECTRODE REM PT RTRN 9FT ADLT (ELECTROSURGICAL) ×1 IMPLANT
GAUZE SPONGE 4X4 12PLY STRL (GAUZE/BANDAGES/DRESSINGS) ×2 IMPLANT
GAUZE XEROFORM 1X8 LF (GAUZE/BANDAGES/DRESSINGS) ×2 IMPLANT
GLOVE BIOGEL PI IND STRL 8 (GLOVE) IMPLANT
GLOVE BIOGEL PI INDICATOR 8 (GLOVE) ×2
GLOVE SURG SS PI 7.0 STRL IVOR (GLOVE) ×1 IMPLANT
GOWN STRL REUS W/ TWL LRG LVL3 (GOWN DISPOSABLE) ×1 IMPLANT
GOWN STRL REUS W/ TWL XL LVL3 (GOWN DISPOSABLE) ×1 IMPLANT
GOWN STRL REUS W/TWL LRG LVL3 (GOWN DISPOSABLE) ×2
GOWN STRL REUS W/TWL XL LVL3 (GOWN DISPOSABLE) ×2
GUIDE WIRE 1.6 MM (WIRE) ×2
GUIDEWIRE 1.6 MM (WIRE) IMPLANT
K-WIRE DBL END TROCAR 6X.045 (WIRE)
K-WIRE DBL END TROCAR 6X.062 (WIRE)
K-WIRE SNGL END 1.2X150 (MISCELLANEOUS) ×4
KIT PROCEDURE DRILL (DRILL) ×1 IMPLANT
KIT TURNOVER KIT A (KITS) ×2 IMPLANT
KWIRE DBL END TROCAR 6X.045 (WIRE) IMPLANT
KWIRE DBL END TROCAR 6X.062 (WIRE) IMPLANT
KWIRE SNGL END 1.2X150 (MISCELLANEOUS) IMPLANT
NS IRRIG 500ML POUR BTL (IV SOLUTION) ×2 IMPLANT
PACK EXTREMITY ARMC (MISCELLANEOUS) ×2 IMPLANT
PAD CAST CTTN 3X4 STRL (SOFTGOODS) IMPLANT
PADDING CAST COTTON 3X4 STRL (SOFTGOODS)
PENCIL SMOKE EVACUATOR (MISCELLANEOUS) ×2 IMPLANT
PLATE LAPIDUS 3H STD RT (Plate) ×1 IMPLANT
RASP SM TEAR CROSS CUT (RASP) ×1 IMPLANT
SCREW 2.7 NON LOCK (Screw) ×1 IMPLANT
SCREW CANN SHT THR 4X34 (Screw) ×1 IMPLANT
SCREW COUNTERSINK 4.0 HEADED (MISCELLANEOUS) IMPLANT
SCREW LOCK PLATE R3 2.7X12 (Screw) ×1 IMPLANT
SCREW LOCK PLATE R3 2.7X14 (Screw) ×1 IMPLANT
SCREW LOCK PLATE R3 2.7X18 (Screw) ×1 IMPLANT
SPLINT CAST 1 STEP 4X30 (MISCELLANEOUS) ×2 IMPLANT
SPLINT FAST PLASTER 5X30 (CAST SUPPLIES)
SPLINT PLASTER CAST FAST 5X30 (CAST SUPPLIES) IMPLANT
STAPLE DYNACLIP 8X8 (Staple) ×1 IMPLANT
STOCKINETTE STRL 6IN 960660 (GAUZE/BANDAGES/DRESSINGS) ×2 IMPLANT
STRIP CLOSURE SKIN 1/4X4 (GAUZE/BANDAGES/DRESSINGS) ×2 IMPLANT
SUT ETHILON 5-0 FS-2 18 BLK (SUTURE) ×1 IMPLANT
SUT VIC AB 2-0 SH 27 (SUTURE)
SUT VIC AB 2-0 SH 27XBRD (SUTURE) IMPLANT
SUT VIC AB 3-0 SH 27 (SUTURE)
SUT VIC AB 3-0 SH 27X BRD (SUTURE) IMPLANT
SUT VIC AB 4-0 FS2 27 (SUTURE) ×3 IMPLANT
SUT VICRYL AB 3-0 FS1 BRD 27IN (SUTURE) IMPLANT
WIRE OLIVE SMOOTH 1.4MMX60MM (WIRE) ×4 IMPLANT

## 2019-06-26 NOTE — H&P (Signed)
H and P has been reviewed and no changes are noted.  

## 2019-06-26 NOTE — Op Note (Signed)
Operative note   Surgeon: Dr. Albertine Patricia, DPM.    Assistant: None    Preop diagnosis: 1.  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  Exostosis second toe right foot    Postop diagnosis: Same    Procedure:   1.  Lapidus hallux valgus correction with Akin osteotomies of the great toe right foot   2.  Partial phalanx removal middle phalanx right second toe       EBL: Less than 10 cc    Anesthesia:general with popliteal block general delivered by the anesthesia team preop.  I injected 0.5% Marcaine plain around the saphenous distribution in the dorsum of the foot postoperatively.    Hemostasis: Ankle tourniquet at 225 mils mercury pressure for 88 minutes    Specimen: None    Complications: None    Operative indications: Chronic pain and discomfort to the right foot unresponsive to conservative care    Procedure:  Patient was brought into the OR and placed on the operating table in thesupine position. After anesthesia was obtained theright lower extremity was prepped and draped in usual sterile fashion.  Operative Report: This time attention was directed to the first ray of the right foot where a 10 cm linear incision was made extending from the medial cuneiform all the way down to the shaft of the proximal phalanx.  This was then deepened sharp dissection bleeders clamped and bovied as required. At this time attention was directed to the first MTP joint where incision was deepened and capsule tissue was divided size longitudinally reflected away from dorsal medial plantar aspects of the metatarsal head.  The medial dorsal medial eminence of bone were noted and this was resected and rasped smoothly.  Attention then directed lateral aspect of the joint where an fibular sesamoidal ligament release and lateral capsulotomy were performed. Next attention was directed to the first metatarsocuneiform joint which was dissected medial laterally from its capsular  attachments.  The first met cuneiform joint was identified this was freed up using a combination of sagittal saw as well as an osteotome.  Once this first ray was mobile we were able to rotated and I elected to freehand cuts to remove the cartilage from both the distal portion of the medial cuneiform and the base of the first metatarsal.  These were done with a slight wedge formation.  The area was then gapped open and the portions of cartilage were removed.  There was inspected a few areas needed small amount of curettage once the area was cleaned and all bone and cartilage was removed from the previous joint space there was irrigated and the 2 joint spaces were fenestrated.  This point the first metatarsal was positioned up against the cuneiform rotated in the frontal plane and fixated with a 0.062 K wire for temporary fixation.  There is checked FluoroScan good position correction were noted.  Sesamoid position was good reduction of the intermetatarsal angle was excellent.  Next the area was fixated with another 0.062 K wire and then a 4.0 screw from the [employer] set was placed across the site going from dorsal distal lateral to proximal plantar medial.  This was seen to fixate the area nicely with good tight compression.  A straight 4-hole plate was then placed across the area with 3 locking screws and 1 nonlocking.  These were 2 7 screws.  There was an copiously irrigated throughout the entire incision margin.  At this time attention directed to the proximal phalanx which  had earlier been dissected from its periosteal layer and a wedge osteotomy was performed with apex lateral base medial.  The wedge was feathered and closed and fixated with an 8 mm bone staple.  This was checked FluoroScan also good position correction were noted.  AP and lateral view showed excellent position and fixation.  After copious irrigation the periosteum capsule tissue along length incision was closed with 4-0 Vicryl continuous  stitch as were deep superficial fascial layers.  Skin was closed with 4-0 Vicryl subcuticular stitch.  At this time attention directed to the second toe where a linear incision made over the DIP joint the extensor tendon was then divided incised transversely reflected proximally.  The head of the middle phalanx was then resected.  Tissue was then freed away from the medial base of the distal phalanx and a power rasp was used to remove a bony prominence at the base of the distal phalanx.  There was an copiously irrigated and the extensor tendon was then closed with 4-0 Vicryl simple interrupted sutures 3 different sutures were utilized.  Skin was then closed with 5-0 nylon horizontal mattress sutures.  This time a sterile compressive dressing was placed across wounds consisting of Steri-Strips Xeroform gauze 4 x 4's Kling and Kerlix.  The tourniquet was released and brought complete vascularity seen return to all digits of the left foot.   Patient tolerated the procedure and anesthesia well.  Was transported from the OR to the PACU with all vital signs stable and vascular status intact. To be discharged per routine protocol.  Will follow up in approximately 1 week in the outpatient clinic.  

## 2019-06-26 NOTE — Anesthesia Postprocedure Evaluation (Signed)
Anesthesia Post Note  Patient: Zoe Suarez  Procedure(s) Performed: LAPIDUS TYPE RIGHT (Right Foot) Barbie Banner OSTEOTOMY (Right Foot) REMOVAL bone of second toe (Right Foot)     Patient location during evaluation: PACU Anesthesia Type: General Level of consciousness: awake and alert Pain management: pain level controlled Vital Signs Assessment: post-procedure vital signs reviewed and stable Respiratory status: spontaneous breathing, nonlabored ventilation, respiratory function stable and patient connected to nasal cannula oxygen Cardiovascular status: blood pressure returned to baseline and stable Postop Assessment: no apparent nausea or vomiting Anesthetic complications: no    Aarika Moon A  [place] Asberry

## 2019-06-26 NOTE — Anesthesia Procedure Notes (Signed)
Anesthesia Regional Block: Adductor canal block   Pre-Anesthetic Checklist: ,, timeout performed, Correct Patient, Correct Site, Correct Laterality, Correct Procedure, Correct Position, site marked, Risks and benefits discussed,  Surgical consent,  Pre-op evaluation,  At surgeon's request and post-op pain management  Laterality: Right  Prep: chloraprep       Needles:  Injection technique: Single-shot  Needle Type: Echogenic Needle     Needle Length: 9cm  Needle Gauge: 21     Additional Needles:   Procedures:,,,, ultrasound used (permanent image in chart),,,,  Narrative:  Injection made incrementally with aspirations every 5 mL.  Performed by: Personally  Anesthesiologist: Quisha Mabie, Fredric Dine, MD  Additional Notes: Functioning IV was confirmed and monitors applied. Ultrasound guidance: relevant anatomy identified, needle position confirmed, local anesthetic spread visualized around nerve(s)., vascular puncture avoided.  Image printed for medical record.  Negative aspiration and no paresthesias; incremental administration of local anesthetic. The patient tolerated the procedure well. Vitals signes recorded in RN notes.

## 2019-06-26 NOTE — Anesthesia Procedure Notes (Signed)
Anesthesia Regional Block: Popliteal block   Pre-Anesthetic Checklist: ,, timeout performed, Correct Patient, Correct Site, Correct Laterality, Correct Procedure, Correct Position, site marked, Risks and benefits discussed,  Surgical consent,  Pre-op evaluation,  At surgeon's request and post-op pain management  Laterality: Right  Prep: chloraprep       Needles:  Injection technique: Single-shot  Needle Type: Echogenic Needle     Needle Length: 9cm  Needle Gauge: 21     Additional Needles:   Procedures:,,,, ultrasound used (permanent image in chart),,,,  Narrative:  Injection made incrementally with aspirations every 5 mL.  Performed by: Personally  Anesthesiologist: Sinia Antosh, Fredric Dine, MD  Additional Notes: Functioning IV was confirmed and monitors applied. Ultrasound guidance: relevant anatomy identified, needle position confirmed, local anesthetic spread visualized around nerve(s)., vascular puncture avoided.  Image printed for medical record.  Negative aspiration and no paresthesias; incremental administration of local anesthetic. The patient tolerated the procedure well. Vitals signes recorded in RN notes.

## 2019-06-26 NOTE — Transfer of Care (Signed)
Immediate Anesthesia Transfer of Care Note  Patient: Zoe Suarez  Procedure(s) Performed: LAPIDUS TYPE RIGHT (Right Foot) Barbie Banner OSTEOTOMY (Right Foot) REMOVAL bone of second toe (Right Foot)  Patient Location: PACU  Anesthesia Type: General  Level of Consciousness: awake, alert  and patient cooperative  Airway and Oxygen Therapy: Patient Spontanous Breathing and Patient connected to supplemental oxygen  Post-op Assessment: Post-op Vital signs reviewed, Patient's Cardiovascular Status Stable, Respiratory Function Stable, Patent Airway and No signs of Nausea or vomiting  Post-op Vital Signs: Reviewed and stable  Complications: No apparent anesthesia complications

## 2019-06-26 NOTE — Anesthesia Procedure Notes (Signed)
Procedure Name: LMA Insertion Date/Time: 06/26/2019 7:40 AM Performed by: Cameron Ali, CRNA Pre-anesthesia Checklist: Patient identified, Emergency Drugs available, Suction available, Timeout performed and Patient being monitored Patient Re-evaluated:Patient Re-evaluated prior to induction Oxygen Delivery Method: Circle system utilized Preoxygenation: Pre-oxygenation with 100% oxygen Induction Type: IV induction LMA: LMA inserted LMA Size: 4.0 Number of attempts: 1 Placement Confirmation: positive ETCO2 and breath sounds checked- equal and bilateral Tube secured with: Tape Dental Injury: Teeth and Oropharynx as per pre-operative assessment

## 2019-06-27 ENCOUNTER — Encounter: Payer: Self-pay | Admitting: Podiatry

## 2019-12-08 ENCOUNTER — Encounter: Payer: Self-pay | Admitting: Dermatology

## 2019-12-08 ENCOUNTER — Other Ambulatory Visit: Payer: Self-pay

## 2019-12-08 ENCOUNTER — Ambulatory Visit (INDEPENDENT_AMBULATORY_CARE_PROVIDER_SITE_OTHER): Payer: BC Managed Care – PPO | Admitting: Dermatology

## 2019-12-08 DIAGNOSIS — Z1283 Encounter for screening for malignant neoplasm of skin: Secondary | ICD-10-CM | POA: Diagnosis not present

## 2019-12-08 DIAGNOSIS — D229 Melanocytic nevi, unspecified: Secondary | ICD-10-CM

## 2019-12-08 DIAGNOSIS — L814 Other melanin hyperpigmentation: Secondary | ICD-10-CM

## 2019-12-08 DIAGNOSIS — D1801 Hemangioma of skin and subcutaneous tissue: Secondary | ICD-10-CM

## 2019-12-08 DIAGNOSIS — L570 Actinic keratosis: Secondary | ICD-10-CM | POA: Diagnosis not present

## 2019-12-08 DIAGNOSIS — Z85828 Personal history of other malignant neoplasm of skin: Secondary | ICD-10-CM

## 2019-12-08 DIAGNOSIS — L720 Epidermal cyst: Secondary | ICD-10-CM

## 2019-12-08 DIAGNOSIS — L820 Inflamed seborrheic keratosis: Secondary | ICD-10-CM

## 2019-12-08 DIAGNOSIS — D485 Neoplasm of uncertain behavior of skin: Secondary | ICD-10-CM

## 2019-12-08 DIAGNOSIS — L578 Other skin changes due to chronic exposure to nonionizing radiation: Secondary | ICD-10-CM

## 2019-12-08 DIAGNOSIS — L821 Other seborrheic keratosis: Secondary | ICD-10-CM

## 2019-12-08 NOTE — Progress Notes (Signed)
Follow-Up Visit   Subjective  Zoe Suarez is a 64 y.o. female who presents for the following: Annual Exam. Patient presents for total body skin exam for skin cancer screening and mole check. Patient here today for TBSE. Patient does have a history of BCC. There are 2 spots on either side of patient's lower back that have been present for < 1 year. Non-symptomatic.  The following portions of the chart were reviewed this encounter and updated as appropriate:  Tobacco  Allergies  Meds  Problems  Med Hx  Surg Hx  Fam Hx     Review of Systems:  No other skin or systemic complaints except as noted in HPI or Assessment and Plan.  Objective  Well appearing patient in no apparent distress; mood and affect are within normal limits.  A full examination was performed including scalp, head, eyes, ears, nose, lips, neck, chest, axillae, abdomen, back, buttocks, bilateral upper extremities, bilateral lower extremities, hands, feet, fingers, toes, fingernails, and toenails. All findings within normal limits unless otherwise noted below.  Objective  Nose x 3 (3): Erythematous thin papules/macules with gritty scale.   Objective  Right underside breast: 0.5cm cystic nodule  Objective  Left distal medial pretibial, Right buttock: Well healed scar with no evidence of recurrence.   Objective  Back x 2 (2): Erythematous keratotic or waxy stuck-on papule or plaque.   Objective  Left Breast: History of breast cancer   Assessment & Plan  AK (actinic keratosis) (3) Nose x 3  Destruction of lesion - Nose x 3 Complexity: simple   Destruction method: cryotherapy   Informed consent: discussed and consent obtained   Timeout:  patient name, date of birth, surgical site, and procedure verified Lesion destroyed using liquid nitrogen: Yes   Region frozen until ice ball extended beyond lesion: Yes   Outcome: patient tolerated procedure well with no complications   Post-procedure details:  wound care instructions given    Epidermal inclusion cyst Right underside breast  Benign, observe.    History of basal cell carcinoma (BCC) (2) Right buttock; Left distal medial pretibial  No evidence of recurrence, call clinic for new or changing lesions.   Inflamed seborrheic keratosis (2) Back x 2  Destruction of lesion - Back x 2 Complexity: simple   Destruction method: cryotherapy   Informed consent: discussed and consent obtained   Timeout:  patient name, date of birth, surgical site, and procedure verified Lesion destroyed using liquid nitrogen: Yes   Region frozen until ice ball extended beyond lesion: Yes   Outcome: patient tolerated procedure well with no complications   Post-procedure details: wound care instructions given    Neoplasm of uncertain behavior of skin Left Breast  No lymphadenopathy  Skin cancer screening   Lentigines - Scattered tan macules - Discussed due to sun exposure - Benign, observe - Call for any changes  Seborrheic Keratoses - Stuck-on, waxy, tan-brown papules and plaques  - Discussed benign etiology and prognosis. - Observe - Call for any changes  Melanocytic Nevi - Tan-brown and/or pink-flesh-colored symmetric macules and papules - Benign appearing on exam today - Observation - Call clinic for new or changing moles - Recommend daily use of broad spectrum spf 30+ sunscreen to sun-exposed areas.   Hemangiomas - Red papules - Discussed benign nature - Observe - Call for any changes  Actinic Damage - diffuse scaly erythematous macules with underlying dyspigmentation - Recommend daily broad spectrum sunscreen SPF 30+ to sun-exposed areas, reapply every 2 hours  as needed.  - Call for new or changing lesions.  Skin cancer screening performed today.  Return in about 1 year (around 12/07/2020) for TBSE.  Graciella Belton, RMA, am acting as scribe for Sarina Ser, MD .  Documentation: I have reviewed the above  documentation for accuracy and completeness, and I agree with the above.  Sarina Ser, MD

## 2019-12-08 NOTE — Patient Instructions (Addendum)
Recommend daily broad spectrum sunscreen SPF 30+ to sun-exposed areas, reapply every 2 hours as needed. Call for new or changing lesions.  Cryotherapy Aftercare  . Wash gently with soap and water everyday.   . Apply Vaseline and Band-Aid daily until healed.  

## 2019-12-12 ENCOUNTER — Other Ambulatory Visit: Payer: Self-pay | Admitting: Otolaryngology

## 2019-12-12 DIAGNOSIS — R1310 Dysphagia, unspecified: Secondary | ICD-10-CM

## 2019-12-17 ENCOUNTER — Ambulatory Visit: Admission: RE | Admit: 2019-12-17 | Payer: BC Managed Care – PPO | Source: Ambulatory Visit

## 2020-03-17 LAB — COLOGUARD

## 2020-03-17 LAB — EXTERNAL GENERIC LAB PROCEDURE

## 2020-03-26 LAB — COLOGUARD

## 2020-03-26 LAB — EXTERNAL GENERIC LAB PROCEDURE

## 2020-04-06 LAB — EXTERNAL GENERIC LAB PROCEDURE: COLOGUARD: NEGATIVE

## 2020-04-06 LAB — COLOGUARD: COLOGUARD: NEGATIVE

## 2020-09-16 ENCOUNTER — Ambulatory Visit (INDEPENDENT_AMBULATORY_CARE_PROVIDER_SITE_OTHER): Payer: BC Managed Care – PPO | Admitting: Dermatology

## 2020-09-16 ENCOUNTER — Other Ambulatory Visit: Payer: Self-pay

## 2020-09-16 DIAGNOSIS — L239 Allergic contact dermatitis, unspecified cause: Secondary | ICD-10-CM | POA: Diagnosis not present

## 2020-09-16 MED ORDER — HALCINONIDE 0.1 % EX CREA: TOPICAL_CREAM | CUTANEOUS | 2 refills | Status: AC

## 2020-09-16 NOTE — Progress Notes (Signed)
   Follow-Up Visit   Subjective  Zoe Suarez is a 65 y.o. female who presents for the following: Rash (Pt c/o rash on the neck x 4 weeks itching and irritating, ). Pt report she started otc OGX shampoo 6 months ago which she has now stopped using.   The following portions of the chart were reviewed this encounter and updated as appropriate:   Tobacco  Allergies  Meds  Problems  Med Hx  Surg Hx  Fam Hx     Review of Systems:  No other skin or systemic complaints except as noted in HPI or Assessment and Plan.  Objective  Well appearing patient in no apparent distress; mood and affect are within normal limits.  A focused examination was performed including posterior neck . Relevant physical exam findings are noted in the Assessment and Plan.  Objective  Neck - Posterior: Crust and fine flesh papules    Assessment & Plan  Allergic contact dermatitis, possibly from [place] shampoo? With crusting Neck - Posterior Start Halog cream apply to skin bid   May consider Mometasone cream if insurance will not cover Halog   Ordered Medications: Halcinonide (HALOG) 0.1 % CREA  Return if symptoms worsen or fail to improve.  IMarye Round, CMA, am acting as scribe for Sarina Ser, MD .  Documentation: I have reviewed the above documentation for accuracy and completeness, and I agree with the above.  Sarina Ser, MD

## 2020-09-19 ENCOUNTER — Encounter: Payer: Self-pay | Admitting: Dermatology

## 2020-09-20 ENCOUNTER — Telehealth: Payer: Self-pay

## 2020-09-20 ENCOUNTER — Other Ambulatory Visit: Payer: Self-pay

## 2020-09-20 DIAGNOSIS — L239 Allergic contact dermatitis, unspecified cause: Secondary | ICD-10-CM

## 2020-09-20 MED ORDER — MOMETASONE FUROATE 0.1 % EX CREA: TOPICAL_CREAM | CUTANEOUS | 0 refills | Status: AC

## 2020-09-20 NOTE — Telephone Encounter (Signed)
Halcinonide cream denied by insurance. Formulary alternatives listed are desoximetasone (except cream 0.05%), fluocinonide (except cream 0.1%), and Bryhali. Please advise.

## 2020-09-20 NOTE — Telephone Encounter (Signed)
The note indicates sending in Mometasone cream if Halog not covered. Disp 60 g 0 rf. Typically Mometasone is covered even though not in your list.  Let me know if not covered.

## 2020-09-20 NOTE — Telephone Encounter (Signed)
rx sent to pharmacy

## 2020-09-27 ENCOUNTER — Other Ambulatory Visit: Payer: Self-pay | Admitting: Radiology

## 2020-11-02 ENCOUNTER — Telehealth: Payer: Self-pay

## 2020-11-02 NOTE — Telephone Encounter (Signed)
Spoke with patient. She is going to give the medication another week before scheduling appointment.

## 2020-11-02 NOTE — Telephone Encounter (Signed)
Patient called in stating Mometasone that she has been using on her neck for a rash is not improving. Patient has been using cream since February. Patient is asking for something stronger.

## 2020-11-02 NOTE — Telephone Encounter (Signed)
IF not improving, pt may need biopsy and patch testing.   Make pt appt.

## 2020-11-09 ENCOUNTER — Ambulatory Visit (INDEPENDENT_AMBULATORY_CARE_PROVIDER_SITE_OTHER): Payer: BC Managed Care – PPO | Admitting: Dermatology

## 2020-11-09 ENCOUNTER — Encounter: Payer: Self-pay | Admitting: Dermatology

## 2020-11-09 ENCOUNTER — Other Ambulatory Visit: Payer: Self-pay

## 2020-11-09 DIAGNOSIS — D492 Neoplasm of unspecified behavior of bone, soft tissue, and skin: Secondary | ICD-10-CM

## 2020-11-09 DIAGNOSIS — D485 Neoplasm of uncertain behavior of skin: Secondary | ICD-10-CM

## 2020-11-09 DIAGNOSIS — L500 Allergic urticaria: Secondary | ICD-10-CM

## 2020-11-09 MED ORDER — PREDNISONE 20 MG PO TABS
20.0000 mg | ORAL_TABLET | Freq: Every day | ORAL | 0 refills | Status: AC
Start: 2020-11-09 — End: ?

## 2020-11-09 NOTE — Patient Instructions (Addendum)
If you have any questions or concerns for your doctor, please call our main line at [phone] and press option 4 to reach your doctor's medical assistant. If no one answers, please leave a voicemail as directed and we will return your call as soon as possible. Messages left after 4 pm will be answered the following business day.   You may also send us a message via MyChart. We typically respond to MyChart messages within 1-2 business days.  For prescription refills, please ask your pharmacy to contact our office. Our fax number is [phone].  If you have an urgent issue when the clinic is closed that cannot wait until the next business day, you can page your doctor at the number below.    Please note that while we do our best to be available for urgent issues outside of office hours, we are not available 24/7.   If you have an urgent issue and are unable to reach us, you may choose to seek medical care at your doctor's office, retail clinic, urgent care center, or emergency room.  If you have a medical emergency, please immediately call 911 or go to the emergency department.  Pager Numbers  - Dr. Kowalski: [phone]  - Dr. Moye: [phone]  - Dr. Stewart: [phone]  In the event of inclement weather, please call our main line at [phone] for an update on the status of any delays or closures.  Dermatology Medication Tips: Please keep the boxes that topical medications come in in order to help keep track of the instructions about where and how to use these. Pharmacies typically print the medication instructions only on the boxes and not directly on the medication tubes.   If your medication is too expensive, please contact our office at [phone] option 4 or send us a message through MyChart.   We are unable to tell what your co-pay for medications will be in advance as this is different depending on your insurance coverage. However, we may be able to find a  substitute medication at lower cost or fill out paperwork to get insurance to cover a needed medication.   If a prior authorization is required to get your medication covered by your insurance company, please allow us 1-2 business days to complete this process.  Drug prices often vary depending on where the prescription is filled and some pharmacies may offer cheaper prices.  The website www.goodrx.com contains coupons for medications through different pharmacies. The prices here do not account for what the cost may be with help from insurance (it may be cheaper with your insurance), but the website can give you the price if you did not use any insurance.  - You can print the associated coupon and take it with your prescription to the pharmacy.  - You may also stop by our office during regular business hours and pick up a GoodRx coupon card.  - If you need your prescription sent electronically to a different pharmacy, notify our office through [facility] MyChart or by phone at [phone] option 4.     Wound Care Instructions  1. Cleanse wound gently with soap and water once a day then pat dry with clean gauze. Apply a thing coat of Petrolatum (petroleum jelly, "Vaseline") over the wound (unless you have an allergy to this). We recommend that you use a new, sterile tube of Vaseline. Do not pick or remove scabs. Do not remove the yellow or white "healing tissue" from the base of the wound.    Cover the wound with fresh, clean, nonstick gauze and secure with paper tape. You may use Band-Aids in place of gauze and tape if the would is small enough, but would recommend trimming much of the tape off as there is often too much. Sometimes Band-Aids can irritate the skin.  3. You should call the office for your biopsy report after 1 week if you have not already been contacted.  4. If you experience any problems, such as abnormal amounts of bleeding, swelling, significant bruising, significant pain, or evidence of  infection, please call the office immediately.  5. FOR ADULT SURGERY PATIENTS: If you need something for pain relief you may take 1 extra strength Tylenol (acetaminophen) AND 2 Ibuprofen (200mg  each) together every 4 hours as needed for pain. (do not take these if you are allergic to them or if you have a reason you should not take them.) Typically, you may only need pain medication for 1 to 3 days.    Increase Zyrtec to 2 pills at night

## 2020-11-09 NOTE — Progress Notes (Signed)
   Follow-Up Visit   Subjective  Zoe Suarez is a 65 y.o. female who presents for the following: Allergic contact derm (Neck, back,chest getting worse, itchy, mometasone cr bid, otc fungal cream, zyrtec, no trouble eating meat). The following portions of the chart were reviewed this encounter and updated as appropriate:   Tobacco  Allergies  Meds  Problems  Med Hx  Surg Hx  Fam Hx     Review of Systems:  No other skin or systemic complaints except as noted in HPI or Assessment and Plan.  Objective  Well appearing patient in no apparent distress; mood and affect are within normal limits.  A focused examination was performed including back, chest. Relevant physical exam findings are noted in the Assessment and Plan.  Objective  Right Lower Back: Blanching pink eruption some scattered crust back, chest, R inframammary          Assessment & Plan  Rash - Contact dermatitis vs Eczema vs Urticaria vs other Right Lower Back Skin / nail biopsy Type of biopsy: punch   Informed consent: discussed and consent obtained   Timeout: patient name, date of birth, surgical site, and procedure verified   Procedure prep:  Patient was prepped and draped in usual sterile fashion (The patient was cleaned and prepped) Prep type:  Isopropyl alcohol Anesthesia: the lesion was anesthetized in a standard fashion   Anesthetic:  1% lidocaine w/ epinephrine 1-100,000 buffered w/ 8.4% NaHCO3 Punch size:  3.5 mm Suture size:  4-0 Suture type: nylon   Hemostasis achieved with: suture, pressure and aluminum chloride   Outcome: patient tolerated procedure well   Post-procedure details: sterile dressing applied and wound care instructions given   Dressing type: bandage, pressure dressing and bacitracin    Specimen 1 - Surgical pathology Differential Diagnosis: D48.5 Urticaria vs Eczema vs Other  Check Margins: No Blanching pink eruption some scattered crust  Urticaria vs Eczema vs  Other  Increase Zyrtec to 2 po qhs Start Prednisone 20mg  2 po x 2 days, 1 po x 5 days, then 1 po qod x 3 doses Cont Mometasone cr bid  May consider True Patch Test 36 in future. May consider Alpha Gal testing.  Risks of prednisone taper discussed including mood irritability, insomnia, weight gain, stomach ulcers, increased risk of infection, increased blood sugar (diabetes), hypertension, osteoporosis with long-term or frequent use, and rare risk of avascular necrosis of the hip.    Ordered Medications: predniSONE (DELTASONE) 20 MG tablet  Return in about 1 week (around 11/16/2020) for Bx/Suture removal f/u.   I, Othelia Pulling, RMA, am acting as scribe for Sarina Ser, MD .  Documentation: I have reviewed the above documentation for accuracy and completeness, and I agree with the above.  Sarina Ser, MD

## 2020-11-15 ENCOUNTER — Ambulatory Visit: Payer: BC Managed Care – PPO

## 2020-11-17 ENCOUNTER — Ambulatory Visit (INDEPENDENT_AMBULATORY_CARE_PROVIDER_SITE_OTHER): Payer: BC Managed Care – PPO | Admitting: Dermatology

## 2020-11-17 ENCOUNTER — Telehealth: Payer: Self-pay

## 2020-11-17 ENCOUNTER — Encounter: Payer: Self-pay | Admitting: Dermatology

## 2020-11-17 ENCOUNTER — Other Ambulatory Visit: Payer: Self-pay

## 2020-11-17 ENCOUNTER — Other Ambulatory Visit: Payer: Self-pay | Admitting: Dermatology

## 2020-11-17 DIAGNOSIS — L509 Urticaria, unspecified: Secondary | ICD-10-CM | POA: Diagnosis not present

## 2020-11-17 NOTE — Patient Instructions (Addendum)
Start over the counter Allegra 180 mg take 1 tablet daily, after 2 weeks if no better increase to Allegra 180 2 tablets daily   Cont Zyrtec tablets daily as directed

## 2020-11-17 NOTE — Progress Notes (Signed)
   Follow-Up Visit   Subjective  Zoe Suarez is a 65 y.o. female who presents for the following: Follow-up (1 week follow up biopsy proven Urticaria ). Pt here for suture removal and discussion of report and treatment options.  The following portions of the chart were reviewed this encounter and updated as appropriate:   Tobacco  Allergies  Meds  Problems  Med Hx  Surg Hx  Fam Hx     Review of Systems:  No other skin or systemic complaints except as noted in HPI or Assessment and Plan.  Objective  Well appearing patient in no apparent distress; mood and affect are within normal limits.  A focused examination was performed including face, arms,chest,back . Relevant physical exam findings are noted in the Assessment and Plan.  Objective  face, exts, tunk: Blanching pink eruption some scattered crust back, chest, R inframammary   Assessment & Plan  Urticaria face, exts, tunk Biopsy proven Urticaria discussed This condition may come and go, Urticaria could be related to stress, medications; infections; other medical conditions, etc Informational Pamphlet on Urticaria/Hives given.  Start otc Allegra 180 mg take 1 tablet daily, if no better in 1 week increase to 2 tablets daily  After 2 weeks if no better call here and we may consider starting Singulair tablets Xolair is an option in future.   Urticaria pamphlet given   Labs reviewed from 10/2020 ordered by Dr Sabra Heck:  TSH levels low- recommend Dr Sabra Heck consider repeat TSH.  Urinalysis:   Leukocyte Esterase, Urinalysis cloudy, white blood cells and bacteria.  Pt says this is normal Urinalysis for her and she is asymptomatic.  CBC normal  Lipids elevated Chemistry okay  Vitamin B-12 normal   Lab order Alpha Gal panel today to R/O Alpha-Gal IgE positivity.  Pt with no known history of tick bites.  Encounter for Removal of Sutures - Incision site at the right lower back  is clean, dry and intact - Wound cleansed,  sutures removed, wound cleansed and steri strips applied.  - Discussed pathology results showing Urticaria  - Patient advised to keep steri-strips dry until they fall off. - Scars remodel for a full year. - Once steri-strips fall off, patient can apply over-the-counter silicone scar cream each night to help with scar remodeling if desired. - Patient advised to call with any concerns or if they notice any new or changing lesions.   Pt felt faint, light headached during her office visit, check her blood pressure 3 different times  126/72 pulse 78  112/68 pulse 81 108/69 pulse  81 Recommend patient contact Dr Sanjuan Dame office to discuss light headed/faintness. Pt felt OK after about 30 minutes and wanted to go home.  Advised to call Dr Ammie Ferrier office.  She said she felt like it was from wearing a mask as she is not used to wearing a mask.  She had no chest pain or any other pain and was fine before leaving office.  Other Related Procedures Alpha-Gal Panel  Over 40 minutes spent with pt in evaluation and discussion today.  Return for as scheduled Dec 08 2020.  IMarye Round, CMA, am acting as scribe for Sarina Ser, MD .  Documentation: I have reviewed the above documentation for accuracy and completeness, and I agree with the above.  Sarina Ser, MD

## 2020-11-17 NOTE — Telephone Encounter (Signed)
Called pts PCP office Dr Sabra Heck spoke with Jenny Reichmann, discussed this patient was in the office today felt faint, light headedness  low blood pressure reading, Jenny Reichmann will forward this message to Dr Sabra Heck

## 2020-11-17 NOTE — Telephone Encounter (Signed)
Called pt to see how she was doing after feeling faint and light headed during her visit, pt report she is doing and feeling much better, discussed with pt she should see her PCP Dr Sabra Heck to inform him light headedness and blood pressure drop

## 2020-11-22 LAB — ALPHA-GAL PANEL
Allergen Lamb IgE: <0.1 kU/L
Beef IgE: <0.1 kU/L
IgE (Immunoglobulin E), Serum: 11 IU/mL (ref 6–495)
O215-IgE Alpha-Gal: <0.1 kU/L
Pork IgE: <0.1 kU/L

## 2020-11-23 ENCOUNTER — Telehealth: Payer: Self-pay

## 2020-11-23 NOTE — Telephone Encounter (Signed)
Please review info from last visit note with her. If 1 Allegra per day not enough, then she needs to take 2 Allegra per day. If that still not controlling hives, we may send in Singulair. Let me know what she is doing. thanks

## 2020-11-23 NOTE — Telephone Encounter (Signed)
Pt called and stated that she finished the prednisone last week and that the hives had gone away. She states that the hives are back and is wondering if she needs to do another round of the prednisone. Please advise.

## 2020-11-24 ENCOUNTER — Telehealth: Payer: Self-pay

## 2020-11-24 ENCOUNTER — Other Ambulatory Visit: Payer: Self-pay

## 2020-11-24 DIAGNOSIS — L509 Urticaria, unspecified: Secondary | ICD-10-CM

## 2020-11-24 MED ORDER — MONTELUKAST SODIUM 10 MG PO TABS: 10.0000 mg | ORAL_TABLET | Freq: Every day | ORAL | 1 refills | Status: AC

## 2020-11-24 NOTE — Telephone Encounter (Signed)
Left message on voicemail to return my call.  

## 2020-11-24 NOTE — Telephone Encounter (Signed)
Pt states that she has been taking 2 Allegra per day as well as 2 Zyrtec at night.   Pt also added that she is not sure if related but that she has been taking a powder collagen in the mornings and she read that it can cause a rash. She says that she is going to d/c.

## 2020-11-24 NOTE — Telephone Encounter (Signed)
I advised pt of all recommendations from Dr. Nehemiah Massed, she voiced understanding and had no concerns.  Rx sent to pharmacy

## 2020-11-24 NOTE — Telephone Encounter (Signed)
-----   Message from Ralene Bathe, MD sent at 11/24/2020 12:41 PM EDT ----- Serum IgE levels normal Alpha-Gal IgE Level as well as Beef; Pork and Lamb IgE levels all normal  Continue current recommended treatments and keep follow up appts

## 2020-11-24 NOTE — Telephone Encounter (Signed)
Have her STOP powdered collagen. Continue 2 Allegra in AM and 2 Zyrtec in PM Start Singulair 1 po in PM disp #30 with 1 rf.  Keep May appt.

## 2020-12-06 ENCOUNTER — Other Ambulatory Visit: Payer: Self-pay

## 2020-12-06 ENCOUNTER — Ambulatory Visit (INDEPENDENT_AMBULATORY_CARE_PROVIDER_SITE_OTHER): Payer: BC Managed Care – PPO | Admitting: Dermatology

## 2020-12-06 DIAGNOSIS — Z1283 Encounter for screening for malignant neoplasm of skin: Secondary | ICD-10-CM

## 2020-12-06 DIAGNOSIS — L820 Inflamed seborrheic keratosis: Secondary | ICD-10-CM

## 2020-12-06 DIAGNOSIS — Z85828 Personal history of other malignant neoplasm of skin: Secondary | ICD-10-CM | POA: Diagnosis not present

## 2020-12-06 DIAGNOSIS — L814 Other melanin hyperpigmentation: Secondary | ICD-10-CM

## 2020-12-06 DIAGNOSIS — L509 Urticaria, unspecified: Secondary | ICD-10-CM

## 2020-12-06 DIAGNOSIS — D229 Melanocytic nevi, unspecified: Secondary | ICD-10-CM

## 2020-12-06 DIAGNOSIS — L578 Other skin changes due to chronic exposure to nonionizing radiation: Secondary | ICD-10-CM | POA: Diagnosis not present

## 2020-12-06 DIAGNOSIS — L501 Idiopathic urticaria: Secondary | ICD-10-CM

## 2020-12-06 DIAGNOSIS — L821 Other seborrheic keratosis: Secondary | ICD-10-CM

## 2020-12-06 DIAGNOSIS — D1800 Hemangioma unspecified site: Secondary | ICD-10-CM

## 2020-12-06 MED ORDER — OMALIZUMAB 150 MG ~~LOC~~ SOLR: 300.0000 mg | SUBCUTANEOUS | 6 refills | Status: DC

## 2020-12-06 NOTE — Patient Instructions (Signed)

## 2020-12-06 NOTE — Progress Notes (Signed)
Follow-Up Visit   Subjective  Zoe Suarez is a 65 y.o. female who presents for the following: tbse (Patient here today for tbse. She states her main concerns is hives on back. She states she has been dealing with urticaria on back for 2 months now. Patient is taking singular at night, zyrtec, and allegra. Patient has history of aks. ).  Patient here for full body skin exam and skin cancer screening.  The following portions of the chart were reviewed this encounter and updated as appropriate:  Tobacco  Allergies  Meds  Problems  Med Hx  Surg Hx  Fam Hx     Objective  Well appearing patient in no apparent distress; mood and affect are within normal limits.  A full examination was performed including scalp, head, eyes, ears, nose, lips, neck, chest, axillae, abdomen, back, buttocks, bilateral upper extremities, bilateral lower extremities, hands, feet, fingers, toes, fingernails, and toenails. All findings within normal limits unless otherwise noted below.  Objective  back, posterior bilateral shoulders, posterior waistline, scalp: Urticarial patches and excoriated plaques   Images      Objective  Left Dorsal Hand: Erythematous keratotic or waxy stuck-on papule or plaque.   Assessment & Plan  Urticaria -biopsy-proven.  Chronic idiopathic urticaria back, posterior bilateral shoulders, posterior waistline, scalp Chronic and persistent 2 months duration Biopsy proven   At last visit had discussed that condition may come and go, Urticaria could be related to stress, medications; infections; other medical conditions, etc Informational Pamphlet on Urticaria/Hives given.  Review labs at last visit and today from 3/22 (normal) Today also reviewed Alpha Gal lab - negative discussed normal results with patient.   Patient currently taking  Allegra 180 mg taking 2 tablets daily and Singulair tablets daily.   Instructed to start Xolair 150 mg injection with approval - Inject  300 mg into skin every month.   Can start mometasone cream to help with itch  omalizumab (XOLAIR) 150 MG injection - back, posterior bilateral shoulders, posterior waistline, scalp  Other Related Medications montelukast (SINGULAIR) 10 MG tablet  Inflamed seborrheic keratosis Left Dorsal Hand Prior to procedure, discussed risks of blister formation, small wound, skin dyspigmentation, or rare scar following cryotherapy.   Destruction of lesion - Left Dorsal Hand Complexity: simple   Destruction method: cryotherapy   Informed consent: discussed and consent obtained   Timeout:  patient name, date of birth, surgical site, and procedure verified Lesion destroyed using liquid nitrogen: Yes   Region frozen until ice ball extended beyond lesion: Yes   Outcome: patient tolerated procedure well with no complications   Post-procedure details: wound care instructions given    Lentigines - Scattered tan macules - Due to sun exposure - Benign-appering, observe - Recommend daily broad spectrum sunscreen SPF 30+ to sun-exposed areas, reapply every 2 hours as needed. - Call for any changes  Seborrheic Keratoses - Stuck-on, waxy, tan-brown papules and/or plaques  - Benign-appearing - Discussed benign etiology and prognosis. - Observe - Call for any changes  Melanocytic Nevi - Tan-brown and/or pink-flesh-colored symmetric macules and papules - Benign appearing on exam today - Observation - Call clinic for new or changing moles - Recommend daily use of broad spectrum spf 30+ sunscreen to sun-exposed areas.   Hemangiomas - Red papules - Discussed benign nature - Observe - Call for any changes  Actinic Damage - Chronic condition, secondary to cumulative UV/sun exposure - diffuse scaly erythematous macules with underlying dyspigmentation - Recommend daily broad spectrum sunscreen SPF  30+ to sun-exposed areas, reapply every 2 hours as needed.  - Staying in the shade or wearing long  sleeves, sun glasses (UVA+UVB protection) and wide brim hats (4-inch brim around the entire circumference of the hat) are also recommended for sun protection.  - Call for new or changing lesions.  History of Basal Cell Carcinoma of the Skin - No evidence of recurrence today at Right buttock (2014) and Left distal medial pretibial (2017) - Recommend regular full body skin exams - Recommend daily broad spectrum sunscreen SPF 30+ to sun-exposed areas, reapply every 2 hours as needed.  - Call if any new or changing lesions are noted between office visits  Skin cancer screening performed today.  Return in about 30 days (around 01/05/2021) for urticaria follow up , 1 year tbse .  IRuthell Rummage, CMA, am acting as scribe for Sarina Ser, MD.  Documentation: I have reviewed the above documentation for accuracy and completeness, and I agree with the above.  Sarina Ser, MD

## 2020-12-08 ENCOUNTER — Encounter: Payer: BC Managed Care – PPO | Admitting: Dermatology

## 2020-12-09 ENCOUNTER — Encounter: Payer: Self-pay | Admitting: Dermatology

## 2020-12-09 MED ORDER — OMALIZUMAB 150 MG ~~LOC~~ SOLR: 300.0000 mg | SUBCUTANEOUS | 6 refills | Status: DC

## 2020-12-09 NOTE — Addendum Note (Signed)
Addended by: Ruthell Rummage A on: 12/09/2020 05:50 PM   Modules accepted: Orders

## 2020-12-14 ENCOUNTER — Telehealth: Payer: Self-pay

## 2020-12-14 NOTE — Telephone Encounter (Signed)
Xolair samples were received today. Did you want this patient to come in and get started? This is still processing through insurance at this time.

## 2020-12-14 NOTE — Telephone Encounter (Signed)
Yes - she wants to get the shot ASAP. She is leaving town Friday. Can she come in this afternoon?

## 2020-12-15 ENCOUNTER — Other Ambulatory Visit: Payer: Self-pay

## 2020-12-15 ENCOUNTER — Ambulatory Visit (INDEPENDENT_AMBULATORY_CARE_PROVIDER_SITE_OTHER): Payer: BC Managed Care – PPO | Admitting: Dermatology

## 2020-12-15 DIAGNOSIS — L509 Urticaria, unspecified: Secondary | ICD-10-CM | POA: Diagnosis not present

## 2020-12-15 MED ORDER — OMALIZUMAB 150 MG/ML ~~LOC~~ SOSY
150.0000 mg | PREFILLED_SYRINGE | Freq: Once | SUBCUTANEOUS | Status: AC
  Administered 2020-12-15: 150 mg via SUBCUTANEOUS

## 2020-12-15 NOTE — Progress Notes (Signed)
   Follow-Up Visit   Subjective  Zoe Suarez is a 65 y.o. female who presents for the following: Follow-up (Urticaria - Start Xolair injections today).  The following portions of the chart were reviewed this encounter and updated as appropriate:   Tobacco  Allergies  Meds  Problems  Med Hx  Surg Hx  Fam Hx     Review of Systems:  No other skin or systemic complaints except as noted in HPI or Assessment and Plan.  Objective  Well appearing patient in no apparent distress; mood and affect are within normal limits.  A focused examination was performed including trunk, extremities. Relevant physical exam findings are noted in the Assessment and Plan.  Objective  Trunk, extremities: Urticarial plaques   Assessment & Plan  Chronic idiopathic urticaria -severe Trunk, extremities Chronic and persistent   Continue Allegra 180 mg 2 po qd,  Singulair 10 mg 1 po qd  Xolair 150 mg/ml injections given today to bilateral thighs (patient has history of breast cancer of left breast so injection in left arm is not recommended).  B/P  12:25  139/81  12:40  141/83  12:55  132/81  Over 45 minutes spent in evaluation and treatment of the patient today.  omalizumab Arvid Right) prefilled syringe 150 mg - Trunk, extremities  Other Related Medications montelukast (SINGULAIR) 10 MG tablet omalizumab (XOLAIR) 150 MG injection  Return in about 1 month (around 01/15/2021) for Xolair injection.   I, Ashok Cordia, CMA, am acting as scribe for Sarina Ser, MD .  Documentation: I have reviewed the above documentation for accuracy and completeness, and I agree with the above.  Sarina Ser, MD

## 2020-12-15 NOTE — Telephone Encounter (Signed)
Called patient but call went straight to voicemail. Left message to return my call.

## 2020-12-15 NOTE — Telephone Encounter (Signed)
Patient advised of information and scheduled to come in today.

## 2020-12-15 NOTE — Patient Instructions (Signed)

## 2020-12-16 ENCOUNTER — Other Ambulatory Visit: Payer: Self-pay

## 2020-12-16 DIAGNOSIS — L509 Urticaria, unspecified: Secondary | ICD-10-CM

## 2020-12-16 MED ORDER — OMALIZUMAB 150 MG ~~LOC~~ SOLR: SUBCUTANEOUS | 5 refills | Status: AC

## 2020-12-16 NOTE — Progress Notes (Signed)
Rx needed clarification

## 2020-12-21 ENCOUNTER — Encounter: Payer: Self-pay | Admitting: Dermatology

## 2021-01-06 ENCOUNTER — Telehealth: Payer: Self-pay

## 2021-01-06 MED ORDER — EPINEPHRINE 0.3 MG/0.3ML IJ SOAJ: 0.3000 mg | INTRAMUSCULAR | 1 refills | Status: AC | PRN

## 2021-01-06 NOTE — Telephone Encounter (Signed)
[facility] called today after speaking with pt about her xolair injections and are requesting for the pt an rx for an epipen. OK to send?

## 2021-01-06 NOTE — Telephone Encounter (Signed)
Yes, may send Epipen disp 2 with 1 year rf

## 2021-01-06 NOTE — Addendum Note (Signed)
Addended by: Harriett Sine on: 01/06/2021 10:00 AM   Modules accepted: Orders

## 2021-01-13 ENCOUNTER — Other Ambulatory Visit: Payer: Self-pay

## 2021-01-13 ENCOUNTER — Ambulatory Visit (INDEPENDENT_AMBULATORY_CARE_PROVIDER_SITE_OTHER): Payer: BC Managed Care – PPO | Admitting: Dermatology

## 2021-01-13 DIAGNOSIS — L501 Idiopathic urticaria: Secondary | ICD-10-CM

## 2021-01-13 DIAGNOSIS — L239 Allergic contact dermatitis, unspecified cause: Secondary | ICD-10-CM

## 2021-01-13 DIAGNOSIS — L509 Urticaria, unspecified: Secondary | ICD-10-CM

## 2021-01-13 MED ORDER — OMALIZUMAB 150 MG/ML ~~LOC~~ SOSY
300.0000 mg | PREFILLED_SYRINGE | Freq: Once | SUBCUTANEOUS | Status: AC
  Administered 2021-01-13: 300 mg via SUBCUTANEOUS

## 2021-01-13 NOTE — Progress Notes (Signed)
   Follow-Up Visit   Subjective  Zoe Suarez is a 65 y.o. female who presents for the following: Follow-up (Urticaria follow up - 1 month. Xolair injections today. Xolair 12/15/2020, Allegra 180 mg 2 po qd, Singulair 10 mg 1 po qd - hives are still there).  The following portions of the chart were reviewed this encounter and updated as appropriate:   Tobacco  Allergies  Meds  Problems  Med Hx  Surg Hx  Fam Hx      Review of Systems:  No other skin or systemic complaints except as noted in HPI or Assessment and Plan.  Objective  Well appearing patient in no apparent distress; mood and affect are within normal limits.  A focused examination was performed including back, arms, legs. Relevant physical exam findings are noted in the Assessment and Plan.  Mid Back Pink patches of back   Assessment & Plan  Chronic idiopathic urticaria Mid Back Chronic and persistent  Has had labs to test for Alpha Gal in the past, which were negative. Will order labs to check for Lyme Disease.  Discussed evaluation by [facility] allergist. Patient declines today and may consider in the future.  Continue Allegra 180 mg 2 po qd, Singulair 10 mg 1 po qd   Xolair 150 mg/ml injections given today to bilateral thighs (patient has history of breast cancer of left breast so injection in left arm is not recommended).   B/P   3:20 146/87 - Before Xolair injection   3:45  151/81 - immediately after Xolair injection   4:00 160/104 - 15 minutes after Xolair injection  4:15 154/91 - 30 minutes after Xolair injection  May consider evaluation by [facility] allergist in the future.  Over 45 minutes spent in evaluation and treatment of the patient today.   Lyme Ab/Western Blot Reflex - Mid Back  omalizumab (XOLAIR) prefilled syringe 300 mg - Mid Back  Related Medications montelukast (SINGULAIR) 10 MG tablet Take 1 tablet (10 mg total) by mouth at bedtime.  omalizumab (XOLAIR) 150 MG injection Inject (2)  150 mg syringes (300 mg total) into the skin every 28 days.  Allergic dermatitis Mid Back History of latex allergy - Discussed evaluation by [facility] allergist. Patient declines today and may consider in the future.  She has an Epi Pen should she have a reaction to any latex products.  Return in about 1 month (around 02/12/2021) for xolair injection.  I, Ashok Cordia, CMA, am acting as scribe for Sarina Ser, MD .  Documentation: I have reviewed the above documentation for accuracy and completeness, and I agree with the above.  Sarina Ser, MD

## 2021-01-13 NOTE — Patient Instructions (Signed)

## 2021-01-18 ENCOUNTER — Encounter: Payer: Self-pay | Admitting: Dermatology

## 2021-01-31 ENCOUNTER — Telehealth: Payer: Self-pay

## 2021-01-31 NOTE — Telephone Encounter (Signed)
Pt called she requesting a referral to [facility] allergist, Dr Raliegh Ip had mentioned [facility] allergist at her last office visit but pt would like to go to [facility] allergist here in town.

## 2021-02-01 ENCOUNTER — Telehealth: Payer: Self-pay

## 2021-02-01 DIAGNOSIS — L309 Dermatitis, unspecified: Secondary | ICD-10-CM

## 2021-02-01 NOTE — Telephone Encounter (Signed)
Called pt to let her know Dr Raliegh Ip is okay with her going to [place] [facility], referral sent

## 2021-02-14 ENCOUNTER — Other Ambulatory Visit: Payer: Self-pay

## 2021-02-14 ENCOUNTER — Ambulatory Visit (INDEPENDENT_AMBULATORY_CARE_PROVIDER_SITE_OTHER): Payer: BC Managed Care – PPO

## 2021-02-14 DIAGNOSIS — L509 Urticaria, unspecified: Secondary | ICD-10-CM

## 2021-02-14 MED ORDER — OMALIZUMAB 150 MG/ML ~~LOC~~ SOSY
300.0000 mg | PREFILLED_SYRINGE | Freq: Once | SUBCUTANEOUS | Status: AC
  Administered 2021-02-14: 300 mg via SUBCUTANEOUS

## 2021-02-14 NOTE — Progress Notes (Signed)
Pt here for xolair injections. 150 mg injected into each thigh for a total of 300 mg.   Lot: [id number] Exp: 11/2021

## 2021-03-14 ENCOUNTER — Telehealth: Payer: Self-pay

## 2021-03-14 NOTE — Telephone Encounter (Signed)
Progress notes from [place] [facility] in chart review then under the media tab.

## 2021-03-17 ENCOUNTER — Other Ambulatory Visit: Payer: Self-pay

## 2021-03-17 ENCOUNTER — Ambulatory Visit (INDEPENDENT_AMBULATORY_CARE_PROVIDER_SITE_OTHER): Payer: BC Managed Care – PPO

## 2021-03-17 ENCOUNTER — Ambulatory Visit: Payer: BC Managed Care – PPO

## 2021-03-17 DIAGNOSIS — L509 Urticaria, unspecified: Secondary | ICD-10-CM | POA: Diagnosis not present

## 2021-03-17 MED ORDER — OMALIZUMAB 150 MG/ML ~~LOC~~ SOSY
300.0000 mg | PREFILLED_SYRINGE | Freq: Once | SUBCUTANEOUS | Status: AC
  Administered 2021-03-17: 300 mg via SUBCUTANEOUS

## 2021-03-17 NOTE — Progress Notes (Signed)
Pt here for Xolair injection. Xolair 150 mg injected into each thigh (300 mg total). Pt tolerated well.   Lot: [id number]  Exp: 11/2021  BP before injection - 134/61 Right after - 135/72 15 mins after - 132-80 30 mins after - 115/69

## 2021-04-18 ENCOUNTER — Ambulatory Visit: Payer: Medicare Other | Admitting: Dermatology

## 2021-04-18 ENCOUNTER — Other Ambulatory Visit: Payer: Self-pay

## 2021-04-18 DIAGNOSIS — L501 Idiopathic urticaria: Secondary | ICD-10-CM | POA: Diagnosis not present

## 2021-04-18 DIAGNOSIS — L853 Xerosis cutis: Secondary | ICD-10-CM

## 2021-04-18 DIAGNOSIS — Z85828 Personal history of other malignant neoplasm of skin: Secondary | ICD-10-CM | POA: Diagnosis not present

## 2021-04-18 DIAGNOSIS — L509 Urticaria, unspecified: Secondary | ICD-10-CM

## 2021-04-18 MED ORDER — OMALIZUMAB 150 MG/ML ~~LOC~~ SOSY
300.0000 mg | PREFILLED_SYRINGE | Freq: Once | SUBCUTANEOUS | Status: AC
  Administered 2021-04-18: 300 mg via SUBCUTANEOUS

## 2021-04-18 NOTE — Progress Notes (Signed)
   Follow-Up Visit   Subjective  Zoe Suarez is a 65 y.o. female who presents for the following: Follow-up (Urticaria - 1 month follow up - Xolair injections, seems to be improving).  The following portions of the chart were reviewed this encounter and updated as appropriate:   Tobacco  Allergies  Meds  Problems  Med Hx  Surg Hx  Fam Hx     Review of Systems:  No other skin or systemic complaints except as noted in HPI or Assessment and Plan.  Objective  Well appearing patient in no apparent distress; mood and affect are within normal limits.  A focused examination was performed including back, arms, legs. Relevant physical exam findings are noted in the Assessment and Plan.  Mild dermatographism and 2 excoriations of post neck  Xerosis   Assessment & Plan  Chronic idiopathic urticaria skin Chronic and persistent  Improved on Xolair, bu not to goal. Has had labs to test for Alpha Gal in the past, which were negative. Ordered labs to check for Lyme Disease, but pt has not done.  Discussed evaluation by [facility] allergist. Patient declined.may May consider in the future.  Continue Allegra 180 mg 2 po qd, Singulair 10 mg 1 po qd   Xolair 150 mg/ml injections given today thighs (patient has history of breast cancer of left breast so injection in left arm is not recommended).  At patients request, will plan to d/c Xolair after today and will restart if she flares.   B/P   9:15 150/80 - Before Xolair injection  9:30  152/85 - 15 minutes after Xolair injection   9:45 142/70 - 30 minutes after Xolair injection   Over 45 minutes spent in evaluation and treatment of the patient today.  omalizumab Arvid Right) prefilled syringe 300 mg   Related Medications montelukast (SINGULAIR) 10 MG tablet Take 1 tablet (10 mg total) by mouth at bedtime.  omalizumab (XOLAIR) 150 MG injection Inject (2) 150 mg syringes (300 mg total) into the skin every 28 days.  Xerosis  cutis Recommend Cerave cream daily  History of Basal Cell Carcinoma of the Skin - No evidence of recurrence today - Recommend regular full body skin exams - Recommend daily broad spectrum sunscreen SPF 30+ to sun-exposed areas, reapply every 2 hours as needed.  - Call if any new or changing lesions are noted between office visits  Return for Follow up as scheduled, TBSE.  I, Ashok Cordia, CMA, am acting as scribe for Sarina Ser, MD . Documentation: I have reviewed the above documentation for accuracy and completeness, and I agree with the above.  Sarina Ser, MD

## 2021-04-18 NOTE — Patient Instructions (Signed)

## 2021-04-20 ENCOUNTER — Encounter: Payer: Self-pay | Admitting: Dermatology

## 2021-10-07 ENCOUNTER — Other Ambulatory Visit: Payer: Self-pay

## 2021-10-07 ENCOUNTER — Emergency Department
Admission: EM | Admit: 2021-10-07 | Discharge: 2021-10-07 | Disposition: A | Discharge status: Home / Self Care | Payer: Medicare Other | Attending: Emergency Medicine | Admitting: Emergency Medicine

## 2021-10-07 ENCOUNTER — Encounter: Payer: Self-pay | Admitting: Emergency Medicine

## 2021-10-07 ENCOUNTER — Emergency Department: Payer: Medicare Other

## 2021-10-07 DIAGNOSIS — Z853 Personal history of malignant neoplasm of breast: Secondary | ICD-10-CM | POA: Insufficient documentation

## 2021-10-07 DIAGNOSIS — K440 Diaphragmatic hernia with obstruction, without gangrene: Secondary | ICD-10-CM | POA: Diagnosis not present

## 2021-10-07 DIAGNOSIS — K449 Diaphragmatic hernia without obstruction or gangrene: Secondary | ICD-10-CM

## 2021-10-07 DIAGNOSIS — R1010 Upper abdominal pain, unspecified: Secondary | ICD-10-CM | POA: Diagnosis present

## 2021-10-07 LAB — TROPONIN I (HIGH SENSITIVITY)
Troponin I (High Sensitivity): 5 ng/L (ref <18)
Troponin I (High Sensitivity): 5 ng/L (ref <18)

## 2021-10-07 LAB — URINALYSIS, ROUTINE W REFLEX MICROSCOPIC
Bacteria, UA: NONE SEEN
Bilirubin Urine: NEGATIVE
Glucose, UA: NEGATIVE mg/dL
Hgb urine dipstick: NEGATIVE
Ketones, ur: NEGATIVE mg/dL
Nitrite: NEGATIVE
Protein, ur: NEGATIVE mg/dL
Specific Gravity, Urine: 1.014 (ref 1.005–1.030)
pH: 6 (ref 5.0–8.0)

## 2021-10-07 LAB — CBC
HCT: 44.7 % (ref 36.0–46.0)
Hemoglobin: 14.1 g/dL (ref 12.0–15.0)
MCH: 29.9 pg (ref 26.0–34.0)
MCHC: 31.5 g/dL (ref 30.0–36.0)
MCV: 94.9 fL (ref 80.0–100.0)
Platelets: 265 10*3/uL (ref 150–400)
RBC: 4.71 MIL/uL (ref 3.87–5.11)
RDW: 13.4 % (ref 11.5–15.5)
WBC: 6.5 10*3/uL (ref 4.0–10.5)
nRBC: 0 % (ref 0.0–0.2)

## 2021-10-07 LAB — COMPREHENSIVE METABOLIC PANEL
ALT: 23 U/L (ref 0–44)
AST: 31 U/L (ref 15–41)
Albumin: 4.1 g/dL (ref 3.5–5.0)
Alkaline Phosphatase: 79 U/L (ref 38–126)
Anion gap: 5 (ref 5–15)
BUN: 12 mg/dL (ref 8–23)
CO2: 28 mmol/L (ref 22–32)
Calcium: 9.1 mg/dL (ref 8.9–10.3)
Chloride: 106 mmol/L (ref 98–111)
Creatinine, Ser: 0.7 mg/dL (ref 0.44–1.00)
GFR, Estimated: >60 mL/min (ref >60)
Glucose, Bld: 112 mg/dL — ABNORMAL HIGH (ref 70–99)
Potassium: 3.8 mmol/L (ref 3.5–5.1)
Sodium: 139 mmol/L (ref 135–145)
Total Bilirubin: 0.5 mg/dL (ref 0.3–1.2)
Total Protein: 7.7 g/dL (ref 6.5–8.1)

## 2021-10-07 LAB — LIPASE, BLOOD: Lipase: 38 U/L (ref 11–51)

## 2021-10-07 MED ORDER — PANTOPRAZOLE SODIUM 40 MG PO TBEC: 40.0000 mg | DELAYED_RELEASE_TABLET | Freq: Every day | ORAL | 0 refills | Status: AC

## 2021-10-07 MED ORDER — PANTOPRAZOLE SODIUM 40 MG IV SOLR
40.0000 mg | Freq: Once | INTRAVENOUS | Status: AC
Start: 2021-10-07 — End: 2021-10-07
  Administered 2021-10-07: 40 mg via INTRAVENOUS
  Filled 2021-10-07: qty 10

## 2021-10-07 MED ORDER — IOHEXOL 350 MG/ML SOLN
100.0000 mL | Freq: Once | INTRAVENOUS | Status: AC | PRN
  Administered 2021-10-07: 100 mL via INTRAVENOUS
  Filled 2021-10-07: qty 100

## 2021-10-07 MED ORDER — FENTANYL CITRATE PF 50 MCG/ML IJ SOSY
50.0000 ug | PREFILLED_SYRINGE | Freq: Once | INTRAMUSCULAR | Status: AC
  Administered 2021-10-07: 50 ug via INTRAVENOUS
  Filled 2021-10-07: qty 1

## 2021-10-07 MED ORDER — ONDANSETRON HCL 4 MG/2ML IJ SOLN
4.0000 mg | Freq: Once | INTRAMUSCULAR | Status: AC
Start: 2021-10-07 — End: 2021-10-07
  Administered 2021-10-07: 4 mg via INTRAVENOUS
  Filled 2021-10-07: qty 2

## 2021-10-07 NOTE — Discharge Instructions (Addendum)
stop your omeprazole and start taking the Protonix and see if this helps her symptoms and call GI for further follow-up to discuss further medical management and follow-up with surgery to discuss surgery options. ? ?Return to the ER for worsening pain, fevers or any other concerns ? ?Follow-up for blood pressure recheck with your primary care doctor ?IMPRESSION: ?1. No evidence of an aortic aneurysm or dissection. ?2. Large hiatal hernia. ?3. Diverticulosis without evidence of diverticulitis. ?4. Hepatic steatosis. ?  ?

## 2021-10-07 NOTE — ED Triage Notes (Signed)
C/O upper abdominal pain x 45 minutes.  Denies N/V.  Was at hospital having labs drawn for PCP. ? ?Sent from [facility] for eval. ? ?Patient is AAOx3.  Skin warm and dry. NAD. Posture upright and relaxed. NAD ?

## 2021-10-07 NOTE — ED Provider Notes (Signed)
? ?[facility] ?Provider Note ? ? ? Event Date/Time  ? First MD Initiated Contact with Patient 10/07/21 1040   ?  (approximate) ? ? ?History  ? ?Abdominal Pain ? ? ?HPI ? ?KERA DEACON is a 66 y.o. female with history of breast cancer now in remission, prior cholecystectomy who comes in with sudden onset severe abdominal and chest pain.  Patient reports the pain radiates from her upper abdomen into her chest.  It came on suddenly just prior to arrival.  Denies ever having this previously.  Does state that she was recently started on some medications for GERD.  Reports her blood pressure is usually not this elevated.  Denies any leg swelling.  Denies any calf tenderness, history of blood clots, estrogen use.  She denies feeling short of breath. ? ? ?Physical Exam  ? ?Triage Vital Signs: ?ED Triage Vitals  ?Enc Vitals Group  ?   BP 10/07/21 0917 (!) 161/131  ?   Pulse Rate 10/07/21 0917 63  ?   Resp 10/07/21 0917 16  ?   Temp 10/07/21 0917 99 ?F (37.2 ?C)  ?   Temp Source 10/07/21 0917 Oral  ?   SpO2 10/07/21 0917 99 %  ?   Weight 10/07/21 0915 210 lb 1.6 oz (95.3 kg)  ?   Height 10/07/21 0915 5\' 7"  (1.702 m)  ?   Head Circumference --   ?   Peak Flow --   ?   Pain Score 10/07/21 0914 9  ?   Pain Loc --   ?   Pain Edu? --   ?   Excl. in [place]? --   ? ? ?Most recent vital signs: ?Vitals:  ? 10/07/21 0917 10/07/21 1044  ?BP: (!) 161/131 (!) 150/108  ?Pulse: 63 68  ?Resp: 16 16  ?Temp: 99 ?F (37.2 ?C)   ?SpO2: 99% 99%  ? ? ? ?General: Awake, no distress.  ?CV:  Good peripheral perfusion.  ?Resp:  Normal effort.  ?Abd:  No distention. Tenderness upper abdomen. No garuding ?Other:  Good pulses radial ? ?ED Results / Procedures / Treatments  ? ?Labs ?(all labs ordered are listed, but only abnormal results are displayed) ?Labs Reviewed  ?COMPREHENSIVE METABOLIC PANEL - Abnormal; Notable for the following components:  ?    Result Value  ? Glucose, Bld 112 (*)   ? All other components within normal limits   ?URINALYSIS, ROUTINE W REFLEX MICROSCOPIC - Abnormal; Notable for the following components:  ? Color, Urine YELLOW (*)   ? APPearance HAZY (*)   ? Leukocytes,Ua TRACE (*)   ? All other components within normal limits  ?LIPASE, BLOOD  ?CBC  ?TROPONIN I (HIGH SENSITIVITY)  ? ? ? ?EKG ? ?My interpretation of EKG: ? ?Normal sinus rate of 81 without any ST elevation or T wave inversions except for lead III, normal intervals ? ?RADIOLOGY ?I have reviewed the  CT do not see any dissection-pending report.  ? ? ?PROCEDURES: ? ?Critical Care performed: No ? ?Procedures ? ? ?MEDICATIONS ORDERED IN ED: ?Medications  ?pantoprazole (PROTONIX) injection 40 mg (has no administration in time range)  ?fentaNYL (SUBLIMAZE) injection 50 mcg (has no administration in time range)  ?ondansetron (ZOFRAN) injection 4 mg (has no administration in time range)  ? ? ? ?IMPRESSION / MDM / ASSESSMENT AND PLAN / ED COURSE  ?I reviewed the triage vital signs and the nursing notes. ? ? ?Patient is a 66 year old who comes in with sudden  onset upper abdominal pain radiating to her chest without any shortness of breath.  But just feels a lot of fullness in her upper abdomen feels very tight.  Given patient significantly hypertensive will get CT dissection to evaluate for dissection as well as EKG, cardiac markers to evaluate for ACS.  She reports already having her gallbladder removed so unlikely cholecystitis.  Labs ordered evaluate for any choledocholithiasis.  Patient was given some IV fentanyl, IV Protonix, IV Zofran to help with symptoms. ? ?Lipase normal, urine without any obvious evidence of UTI and patient denies any urinary symptoms.  CMP normal.  CBC normal.  Initial troponin is negative but getting a repeat due to onset of timing. ? ?CT scan is reassuring.  Provided a copy of report.  Patient does have large hiatal hernia.  Patient was not aware of this.  Discussed with patient this could be causing her symptoms given this is exactly where  her discomfort is located at.  We will give her GI, surgery follow-up.  Patient already on over-the-counter medication but she is not sure of the dose.  We will start her on Protonix 40 mg until she can get follow-up and she return to the ER if she feels worsening symptoms.  Patient expressed understanding felt comfortable with plan ? ?Considered admission given patient came in with epigastric pain that could have been cardiac in nature but given reassuring work-up and patient reports she is now chest pain and abdominal pain-free patient feels comfortable with discharge home ? ?Patient will follow-up with her primary care doctor for blood pressure check next week  ? ?I discussed the provisional nature of ED diagnosis, the treatment so far, the ongoing plan of care, follow up appointments and return precautions with the patient and any family or support people present. They expressed understanding and agreed with the plan, discharged home. ? ? ? ? ? ?FINAL CLINICAL IMPRESSION(S) / ED DIAGNOSES  ? ?Final diagnoses:  ?Hiatal hernia  ? ? ? ?Rx / DC Orders  ? ?ED Discharge Orders   ? ?      Ordered  ?  pantoprazole (PROTONIX) 40 MG tablet  Daily       ? 10/07/21 1253  ? ?  ?  ? ?  ? ? ? ?Note:  This document was prepared using Dragon voice recognition software and may include unintentional dictation errors. ?  ?Vanessa Bolindale, MD ?10/07/21 1254 ? ?

## 2021-12-14 ENCOUNTER — Encounter: Payer: BC Managed Care – PPO | Admitting: Dermatology

## 2022-11-02 IMAGING — CT CT ANGIO CHEST-ABD-PELV FOR DISSECTION W/ AND WO/W CM
2 of 7 series · 13 of 46 positions shown, 15 images · IV contrast (APPLIED)
Comparison: None.

CLINICAL DATA: Acute aortic syndrome, abdominal pain for 45 minutes

EXAM:
CT ANGIOGRAPHY CHEST, ABDOMEN AND PELVIS
TECHNIQUE: Non-contrast CT of the chest was initially obtained.

[Series 7: axial arterial · axial · arterial · 0.71mm/px · z∈[-5,+550]mm · 10 of 217 slices shown, 12 images]
[im 16/217  soft-tissue]
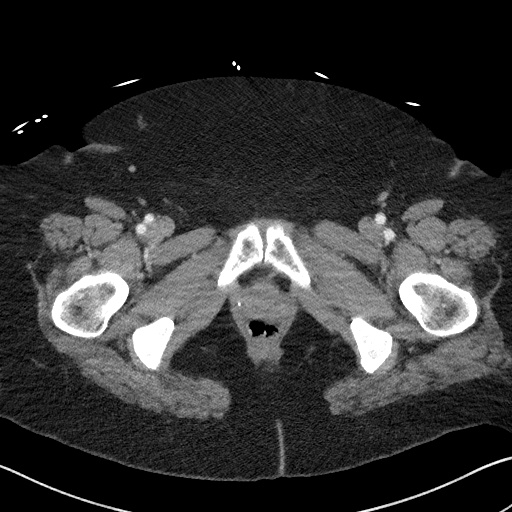
[im 16/217  bone]
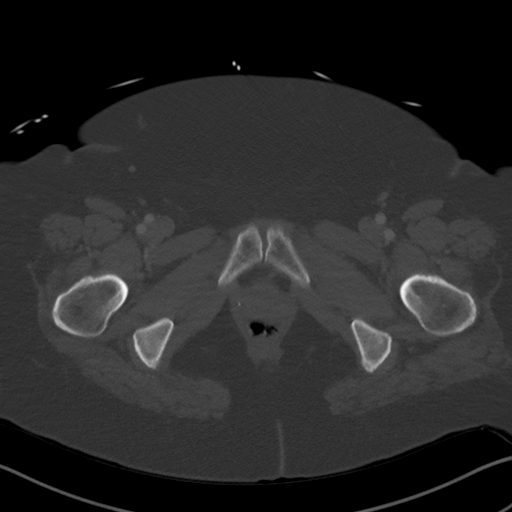
[im 31/217  soft-tissue]
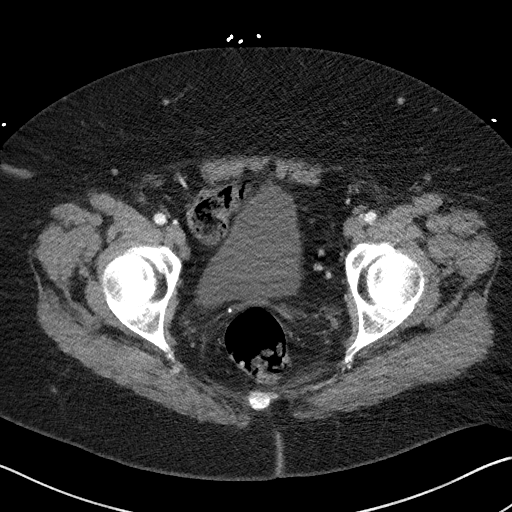
[im 62/217  soft-tissue]
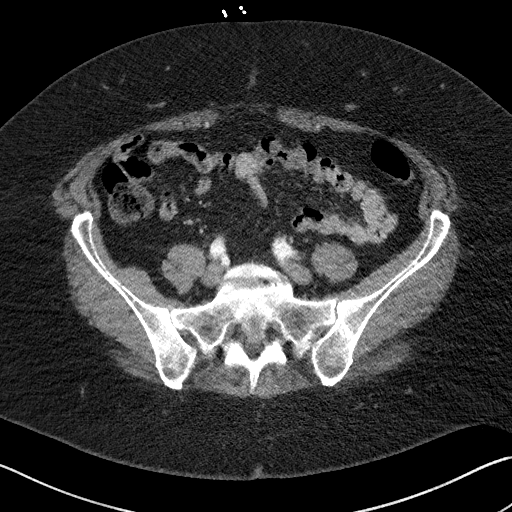
[im 78/217  soft-tissue]
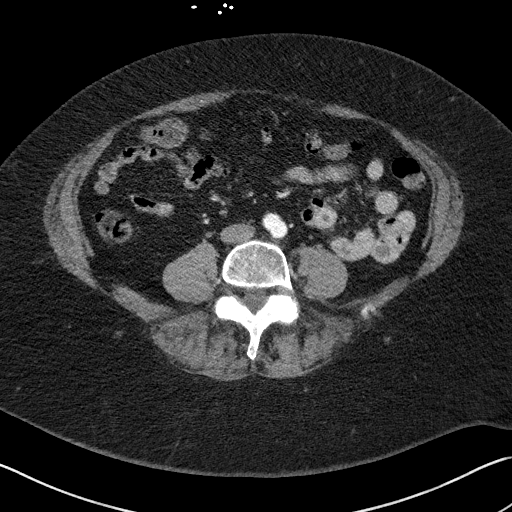
[im 93/217  soft-tissue]
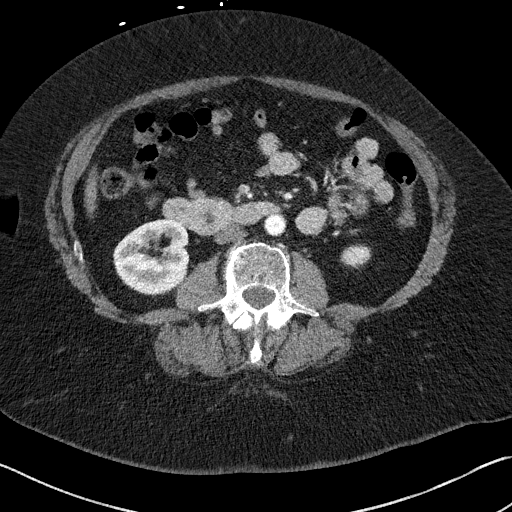
[im 124/217  soft-tissue]
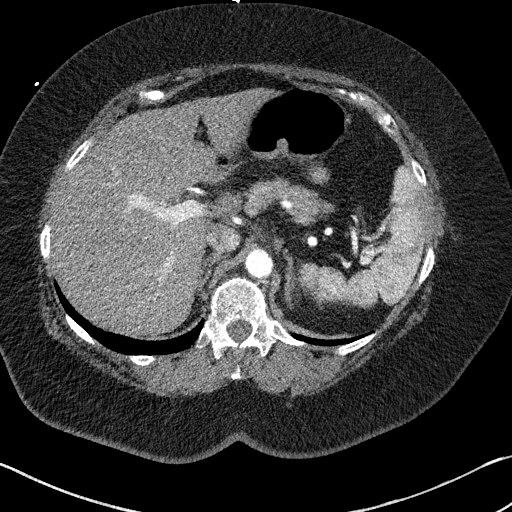
[im 139/217  soft-tissue]
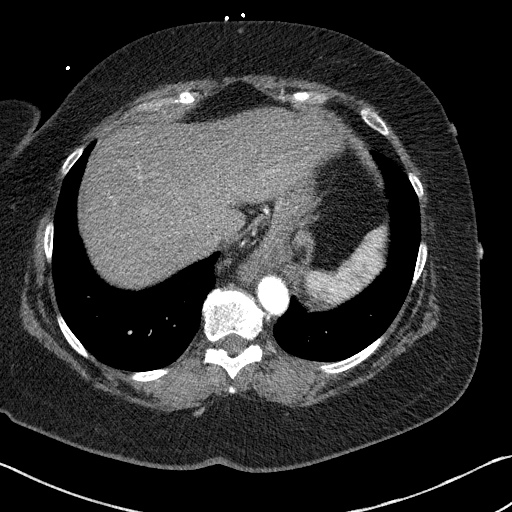
[im 155/217  soft-tissue]
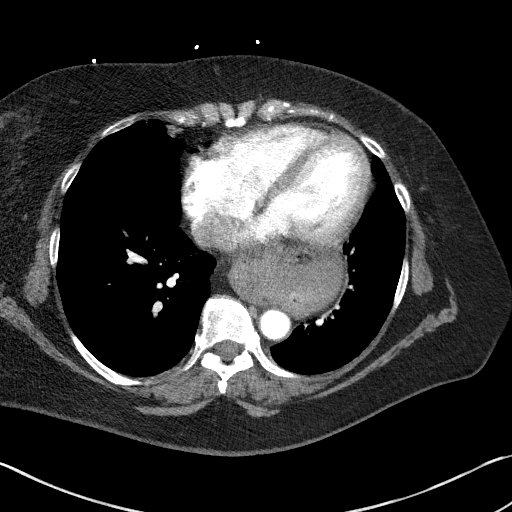
[im 186/217  soft-tissue]
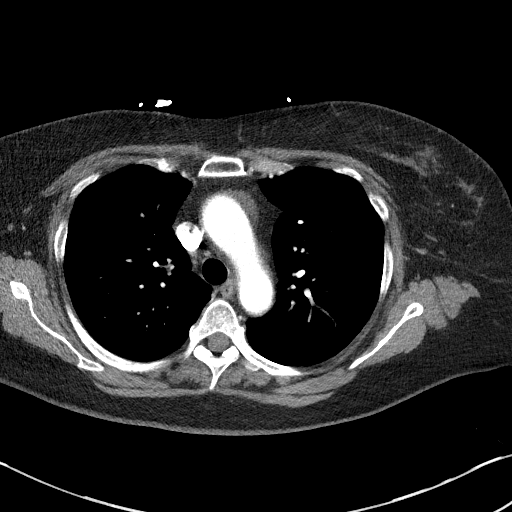
[im 186/217  bone]
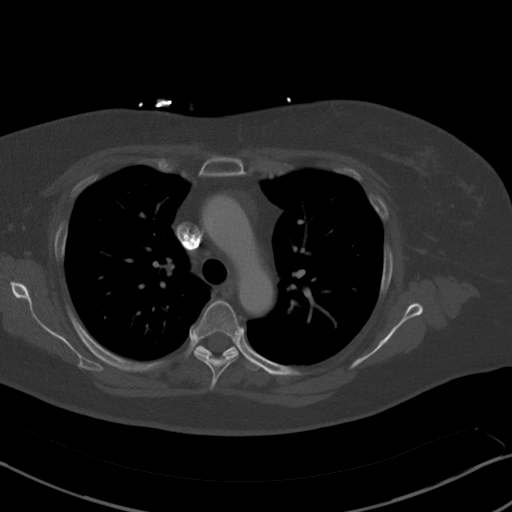
[im 201/217  soft-tissue]
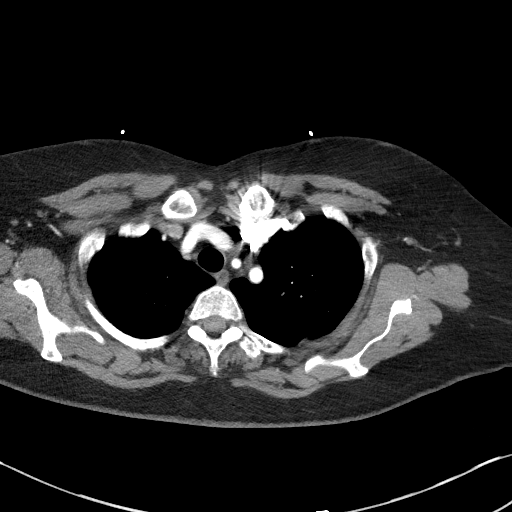

[Series 10: coronals · coronal · 0.72mm/px · 3 of 148 slices shown]
[im 37/148  soft-tissue]
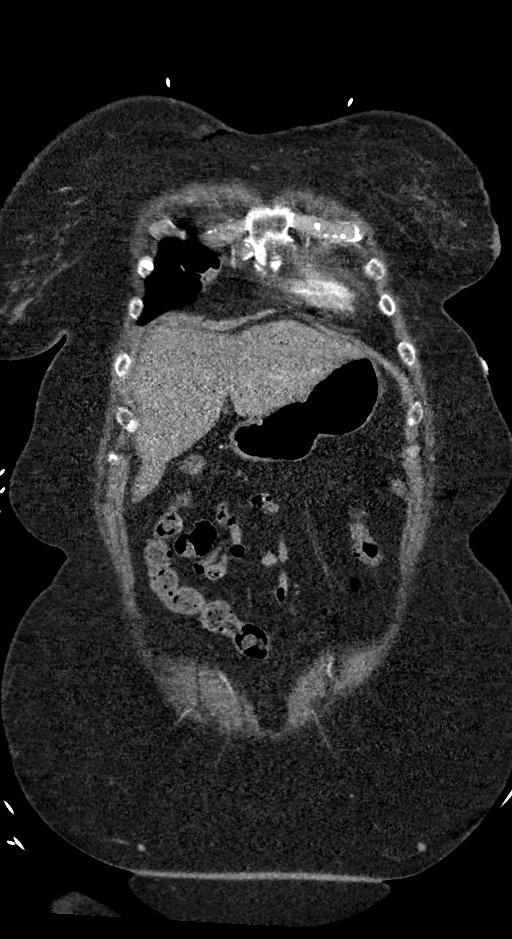
[im 74/148  soft-tissue]
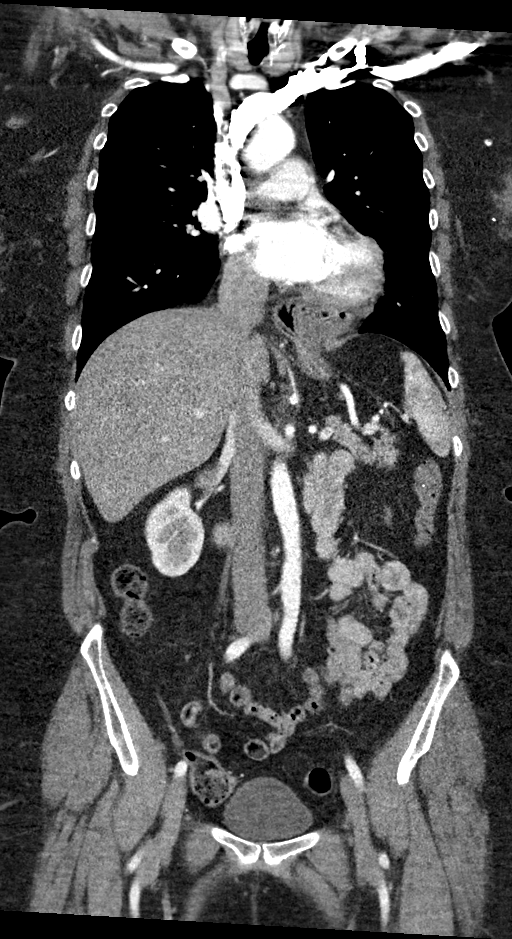
[im 111/148  soft-tissue]
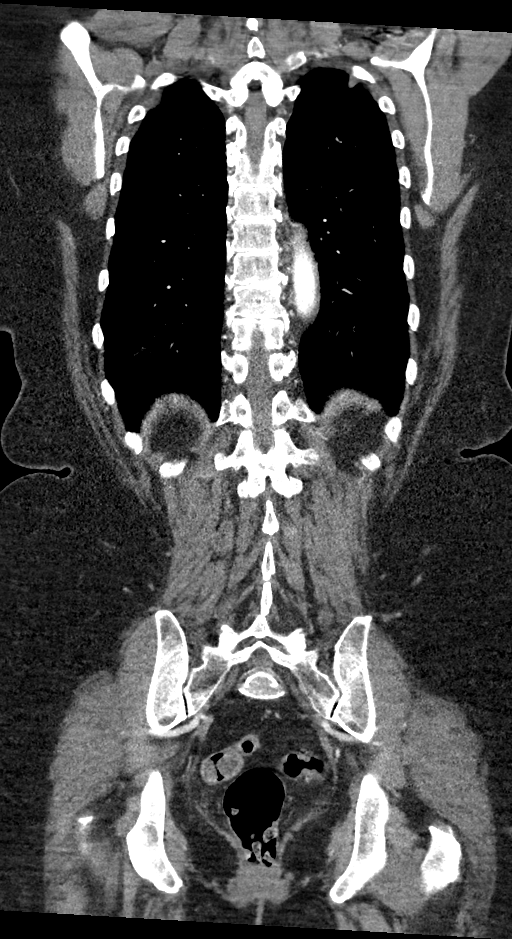

[13 of 46 positions shown; findings below may reference images not displayed]

Multidetector CT imaging through the chest, abdomen and pelvis was
performed using the standard protocol during bolus administration of
intravenous contrast. Multiplanar reconstructed images and MIPs were
obtained and reviewed to evaluate the vascular anatomy.

RADIATION DOSE REDUCTION: This exam was performed according to the
departmental dose-optimization program which includes automated
exposure control, adjustment of the mA and/or kV according to
patient size and/or use of iterative reconstruction technique.

CONTRAST:  100mL OMNIPAQUE IOHEXOL 350 MG/ML SOLN
FINDINGS: CTA CHEST FINDINGS

Cardiovascular: Preferential opacification of the thoracic aorta. No
evidence of thoracic aortic aneurysm or dissection. Normal heart
size. No pericardial effusion.

Mediastinum/Nodes: No enlarged mediastinal, hilar, or axillary lymph
nodes. Thyroid gland, trachea, and esophagus demonstrate no
significant findings.

Lungs/Pleura: Lungs are clear. No pleural effusion or pneumothorax.

Musculoskeletal: No chest wall abnormality. No acute or significant
osseous findings.

Review of the MIP images confirms the above findings.

CTA ABDOMEN AND PELVIS FINDINGS

VASCULAR

Aorta: Normal caliber aorta without aneurysm, dissection, vasculitis
or significant stenosis.

Celiac: Patent without evidence of aneurysm, dissection, vasculitis
or significant stenosis.

SMA: Patent without evidence of aneurysm, dissection, vasculitis or
significant stenosis.

Renals: Both renal arteries are patent without evidence of aneurysm,
dissection, vasculitis, fibromuscular dysplasia or significant
stenosis.

IMA: Patent without evidence of aneurysm, dissection, vasculitis or
significant stenosis.

Inflow: Patent without evidence of aneurysm, dissection, vasculitis
or significant stenosis.

Veins: No obvious venous abnormality within the limitations of this
arterial phase study.

Review of the MIP images confirms the above findings.

NON-VASCULAR

Hepatobiliary: No focal liver abnormality is seen. Low-attenuation
of the liver as can be seen with hepatic steatosis. Prior
cholecystectomy. No intrahepatic or extrahepatic biliary ductal
dilatation.

Pancreas: Unremarkable. No pancreatic ductal dilatation or
surrounding inflammatory changes.

Spleen: Normal in size without focal abnormality.

Adrenals/Urinary Tract: Adrenal glands are unremarkable. Kidneys are
normal, without renal calculi, focal lesion, or hydronephrosis.
Bladder is unremarkable.

Stomach/Bowel: Large hiatal hernia. Stomach is otherwise within
normal limits. No evidence of bowel wall thickening, distention, or
inflammatory changes. Diverticulosis without evidence of
diverticulitis.

Vascular/Lymphatic: No significant vascular findings are present. No
enlarged abdominal or pelvic lymph nodes.

Reproductive: Status post hysterectomy. No adnexal masses.

Other: No abdominal wall hernia or abnormality. No abdominopelvic
ascites.

Musculoskeletal: No acute osseous abnormality. No aggressive osseous
lesion. Mild osteoarthritis of bilateral SI joints. Degenerative
disease with mild disc height loss at L4-5 with bilateral facet
arthropathy. Bilateral facet arthropathy at L5-S1.

Review of the MIP images confirms the above findings.
IMPRESSION: 1. No evidence of an aortic aneurysm or dissection.
2. Large hiatal hernia.
3. Diverticulosis without evidence of diverticulitis.
4. Hepatic steatosis.

## 2023-05-05 LAB — EXTERNAL GENERIC LAB PROCEDURE

## 2023-05-05 LAB — COLOGUARD

## 2023-06-22 LAB — EXTERNAL GENERIC LAB PROCEDURE

## 2023-06-22 LAB — COLOGUARD

## 2023-09-10 ENCOUNTER — Ambulatory Visit
Admission: RE | Admit: 2023-09-10 | Discharge: 2023-09-10 | Disposition: A | Discharge status: Home / Self Care | Payer: Medicare Other | Attending: Gastroenterology | Admitting: Gastroenterology

## 2023-09-10 ENCOUNTER — Encounter
Admission: RE | Disposition: A | Discharge status: Home / Self Care | Payer: Self-pay | Source: Home / Self Care | Attending: Gastroenterology

## 2023-09-10 ENCOUNTER — Encounter: Payer: Self-pay | Admitting: *Deleted

## 2023-09-10 ENCOUNTER — Ambulatory Visit: Payer: Medicare Other | Admitting: Anesthesiology

## 2023-09-10 DIAGNOSIS — K5730 Diverticulosis of large intestine without perforation or abscess without bleeding: Secondary | ICD-10-CM | POA: Diagnosis not present

## 2023-09-10 DIAGNOSIS — K2970 Gastritis, unspecified, without bleeding: Secondary | ICD-10-CM | POA: Diagnosis not present

## 2023-09-10 DIAGNOSIS — K529 Noninfective gastroenteritis and colitis, unspecified: Secondary | ICD-10-CM | POA: Insufficient documentation

## 2023-09-10 DIAGNOSIS — I10 Essential (primary) hypertension: Secondary | ICD-10-CM | POA: Insufficient documentation

## 2023-09-10 DIAGNOSIS — Z90710 Acquired absence of both cervix and uterus: Secondary | ICD-10-CM | POA: Insufficient documentation

## 2023-09-10 DIAGNOSIS — K640 First degree hemorrhoids: Secondary | ICD-10-CM | POA: Insufficient documentation

## 2023-09-10 DIAGNOSIS — Z1211 Encounter for screening for malignant neoplasm of colon: Secondary | ICD-10-CM | POA: Diagnosis present

## 2023-09-10 DIAGNOSIS — K219 Gastro-esophageal reflux disease without esophagitis: Secondary | ICD-10-CM | POA: Insufficient documentation

## 2023-09-10 DIAGNOSIS — R1310 Dysphagia, unspecified: Secondary | ICD-10-CM | POA: Insufficient documentation

## 2023-09-10 DIAGNOSIS — K449 Diaphragmatic hernia without obstruction or gangrene: Secondary | ICD-10-CM | POA: Insufficient documentation

## 2023-09-10 DIAGNOSIS — K3189 Other diseases of stomach and duodenum: Secondary | ICD-10-CM | POA: Insufficient documentation

## 2023-09-10 HISTORY — PX: ESOPHAGOGASTRODUODENOSCOPY (EGD) WITH PROPOFOL: SHX5813

## 2023-09-10 HISTORY — PX: BIOPSY: SHX5522

## 2023-09-10 HISTORY — PX: COLONOSCOPY WITH PROPOFOL: SHX5780

## 2023-09-10 SURGERY — COLONOSCOPY WITH PROPOFOL
Anesthesia: General

## 2023-09-10 MED ORDER — SODIUM CHLORIDE 0.9 % IV SOLN: INTRAVENOUS | Status: DC

## 2023-09-10 MED ORDER — PROPOFOL 500 MG/50ML IV EMUL
INTRAVENOUS | Status: DC | PRN
  Administered 2023-09-10: 145 ug/kg/min via INTRAVENOUS

## 2023-09-10 MED ORDER — PROPOFOL 10 MG/ML IV BOLUS
INTRAVENOUS | Status: DC | PRN
  Administered 2023-09-10: 70 mg via INTRAVENOUS
  Administered 2023-09-10: 40 mg via INTRAVENOUS
  Administered 2023-09-10: 10 mg via INTRAVENOUS
  Administered 2023-09-10: 20 mg via INTRAVENOUS

## 2023-09-10 MED ORDER — LIDOCAINE HCL (CARDIAC) PF 100 MG/5ML IV SOSY
PREFILLED_SYRINGE | INTRAVENOUS | Status: DC | PRN
  Administered 2023-09-10: 100 mg via INTRAVENOUS

## 2023-09-10 NOTE — Anesthesia Preprocedure Evaluation (Signed)
Anesthesia Evaluation  Patient identified by MRN, date of birth, ID band Patient awake    Reviewed: Allergy & Precautions, H&P , NPO status , Patient's Chart, lab work & pertinent test results, reviewed documented beta blocker date and time   History of Anesthesia Complications Negative for: history of anesthetic complications  Airway Mallampati: II  TM Distance: >3 FB Neck ROM: full    Dental  (+) Dental Advidsory Given, Caps, Teeth Intact   Pulmonary neg pulmonary ROS   Pulmonary exam normal breath sounds clear to auscultation       Cardiovascular Exercise Tolerance: Good negative cardio ROS Normal cardiovascular exam Rhythm:regular Rate:Normal     Neuro/Psych negative neurological ROS  negative psych ROS   GI/Hepatic Neg liver ROS,GERD  ,,  Endo/Other  neg diabetes  Class 3 obesity  Renal/GU negative Renal ROS  negative genitourinary   Musculoskeletal   Abdominal   Peds  Hematology negative hematology ROS (+)   Anesthesia Other Findings Past Medical History: No date: Arthritis     Comment:  feet 12/26/2012: Basal cell carcinoma     Comment:  right buttock 01/13/2016: Basal cell carcinoma     Comment:  left distal medial pretibial No date: Cancer (HCC)     Comment:  breast No date: Hyperlipidemia No date: Motion sickness     Comment:  back seat cars No date: Wears contact lenses     Comment:  sometimes   Reproductive/Obstetrics negative OB ROS                             Anesthesia Physical Anesthesia Plan  ASA: 3  Anesthesia Plan: General   Post-op Pain Management:    Induction: Intravenous  PONV Risk Score and Plan: 3 and Propofol infusion, TIVA and Treatment may vary due to age or medical condition  Airway Management Planned: Natural Airway and Nasal Cannula  Additional Equipment:   Intra-op Plan:   Post-operative Plan:   Informed Consent: I have reviewed  the patients History and Physical, chart, labs and discussed the procedure including the risks, benefits and alternatives for the proposed anesthesia with the patient or authorized representative who has indicated his/her understanding and acceptance.     Dental Advisory Given  Plan Discussed with: Anesthesiologist, CRNA and Surgeon  Anesthesia Plan Comments:        Anesthesia Quick Evaluation

## 2023-09-10 NOTE — H&P (Signed)
Outpatient short stay form Pre-procedure 09/10/2023  Regis Bill, MD  Primary Physician: Danella Penton, MD  Reason for visit:  Dyspepsia/Colon cancer screening  History of present illness:    68 y/o lady with history of morbid obesity, hypertension, and large hiatal hernia. No blood thinners. No family history of GI malignancies. History of hysterectomy.    Current Facility-Administered Medications:    0.9 %  sodium chloride infusion, , Intravenous, Continuous, Charis Juliana, Rossie Muskrat, MD, Last Rate: 20 mL/hr at 09/10/23 0946, Continued from Pre-op at 09/10/23 0946  Medications Prior to Admission  Medication Sig Dispense Refill Last Dose/Taking   cetirizine (ZYRTEC) 10 MG tablet Take 10 mg by mouth daily. (Patient not taking: Reported on 12/06/2020)      enoxaparin (LOVENOX) 40 MG/0.4ML injection Inject 0.4 mLs (40 mg total) into the skin daily. (Patient not taking: Reported on 09/03/2023) 2 mL 0 Not Taking   EPINEPHrine 0.3 mg/0.3 mL IJ SOAJ injection Inject 0.3 mg into the muscle as needed for anaphylaxis. 2 each 1    Halcinonide (HALOG) 0.1 % CREA Apply to skin bid prn 30 g 2    hydrochlorothiazide (HYDRODIURIL) 25 MG tablet Take 12.5 mg by mouth daily.  (Patient not taking: No sig reported)      Magnesium 400 MG CAPS Take 400 mg by mouth daily.       mometasone (ELOCON) 0.1 % cream Apply to affected areas twice a day. 60 g 0    montelukast (SINGULAIR) 10 MG tablet Take 1 tablet (10 mg total) by mouth at bedtime. 30 tablet 1    Multiple Vitamin (MULTIVITAMIN) tablet Take 1 tablet by mouth daily.      omalizumab (XOLAIR) 150 MG injection Inject (2) 150 mg syringes (300 mg total) into the skin every 28 days. 2 each 5    Omega-3 Fatty Acids (FISH OIL) 1200 MG CAPS Take 1,200 mg by mouth daily.       oxyCODONE-acetaminophen (PERCOCET) 7.5-325 MG tablet Take 1 tablet by mouth every 4 (four) hours as needed for severe pain. (Patient not taking: No sig reported) 30 tablet 0    pantoprazole  (PROTONIX) 40 MG tablet Take 1 tablet (40 mg total) by mouth daily. 30 tablet 0    Potassium 99 MG TABS Take 99 mg by mouth daily.       predniSONE (DELTASONE) 20 MG tablet Take 1 tablet (20 mg total) by mouth daily with breakfast. Take 2 tabs po for 2 days, then 1 tab pro for 5 days, then 1 tab po qod for 3 doses 12 tablet 0    promethazine (PHENERGAN) 12.5 MG tablet Take 1 tablet (12.5 mg total) by mouth every 6 (six) hours as needed for nausea or vomiting. (Patient not taking: No sig reported) 30 tablet 0    Pyridoxine HCl (VITAMIN B-6 PO) Take by mouth daily.      Thiamine HCl (VITAMIN B-1 PO) Take by mouth daily.      vitamin B-12 (CYANOCOBALAMIN) 500 MCG tablet Take 500 mcg by mouth 2 (two) times daily.      vitamin E 400 UNIT capsule Take 400 Units by mouth daily.        Allergies  Allergen Reactions   Codeine     Severe headache   Latex Itching    Gloves only   Vicodin [Hydrocodone-Acetaminophen] Other (See Comments)    Caused sever headache   Adhesive [Tape] Rash     Past Medical History:  Diagnosis Date  Arthritis    feet   Basal cell carcinoma 12/26/2012   right buttock   Basal cell carcinoma 01/13/2016   left distal medial pretibial   Cancer (HCC)    breast   Hyperlipidemia    Motion sickness    back seat cars   Wears contact lenses    sometimes    Review of systems:  Otherwise negative.    Physical Exam  Gen: Alert, oriented. Appears stated age.  HEENT: PERRLA. Lungs: No respiratory distress CV: RRR Abd: soft, benign, no masses Ext: No edema    Planned procedures: Proceed with EGD/colonoscopy. The patient understands the nature of the planned procedure, indications, risks, alternatives and potential complications including but not limited to bleeding, infection, perforation, damage to internal organs and possible oversedation/side effects from anesthesia. The patient agrees and gives consent to proceed.  Please refer to procedure notes for  findings, recommendations and patient disposition/instructions.     Regis Bill, MD [facility] Gastroenterology

## 2023-09-10 NOTE — Transfer of Care (Signed)
Immediate Anesthesia Transfer of Care Note  Patient: Zoe Suarez  Procedure(s) Performed: COLONOSCOPY WITH PROPOFOL ESOPHAGOGASTRODUODENOSCOPY (EGD) WITH PROPOFOL BIOPSY  Patient Location: Endoscopy Unit  Anesthesia Type:General  Level of Consciousness: awake, drowsy, and patient cooperative  Airway & Oxygen Therapy: Patient Spontanous Breathing and Patient connected to face mask oxygen  Post-op Assessment: Report given to RN and Post -op Vital signs reviewed and stable  Post vital signs: Reviewed and stable  Last Vitals:  Vitals Value Taken Time  BP 114/75 09/10/23 1021  Temp    Pulse 125 09/10/23 1021  Resp 18 09/10/23 1021  SpO2 98 % 09/10/23 1021    Last Pain:  Vitals:   09/10/23 1021  TempSrc:   PainSc: 0-No pain         Complications: No notable events documented.

## 2023-09-10 NOTE — Op Note (Signed)
[facility] Gastroenterology Patient Name: Zoe Suarez Procedure Date: 09/10/2023 9:47 AM MRN: [id number] Account #: [id number] Date of Birth: Feb 23, 1956 Admit Type: Outpatient Age: 68 Room: [facility] ENDO ROOM 3 Gender: Female Note Status: Finalized Instrument Name: Prentice Docker [id number] Procedure:             Colonoscopy Indications:           Chronic diarrhea Providers:             Eather Colas MD, MD Referring MD:          Danella Penton, MD (Referring MD) Medicines:             Monitored Anesthesia Care Complications:         No immediate complications. Procedure:             Pre-Anesthesia Assessment:                        - Prior to the procedure, a History and Physical was                         performed, and patient medications and allergies were                         reviewed. The patient is competent. The risks and                         benefits of the procedure and the sedation options and                         risks were discussed with the patient. All questions                         were answered and informed consent was obtained.                         Patient identification and proposed procedure were                         verified by the physician, the nurse, the                         anesthesiologist, the anesthetist and the technician                         in the endoscopy suite. Mental Status Examination:                         alert and oriented. Airway Examination: normal                         oropharyngeal airway and neck mobility. Respiratory                         Examination: clear to auscultation. CV Examination:                         normal. Prophylactic Antibiotics: The patient does not  require prophylactic antibiotics. Prior                         Anticoagulants: The patient has taken no anticoagulant                         or antiplatelet agents. ASA Grade Assessment: III - A                          patient with severe systemic disease. After reviewing                         the risks and benefits, the patient was deemed in                         satisfactory condition to undergo the procedure. The                         anesthesia plan was to use monitored anesthesia care                         (MAC). Immediately prior to administration of                         medications, the patient was re-assessed for adequacy                         to receive sedatives. The heart rate, respiratory                         rate, oxygen saturations, blood pressure, adequacy of                         pulmonary ventilation, and response to care were                         monitored throughout the procedure. The physical                         status of the patient was re-assessed after the                         procedure.                        After obtaining informed consent, the colonoscope was                         passed under direct vision. Throughout the procedure,                         the patient's blood pressure, pulse, and oxygen                         saturations were monitored continuously. The                         Colonoscope was introduced through the anus and  advanced to the the terminal ileum, with                         identification of the appendiceal orifice and IC                         valve. The colonoscopy was somewhat difficult due to                         the patient's body habitus. The patient tolerated the                         procedure well. The quality of the bowel preparation                         was adequate to identify polyps. The terminal ileum,                         ileocecal valve, appendiceal orifice, and rectum were                         photographed. Findings:      The perianal and digital rectal examinations were normal.      The terminal ileum appeared normal.      Multiple small-mouthed  diverticula were found in the sigmoid colon.      Internal hemorrhoids were found during endoscopy. The hemorrhoids were       Grade I (internal hemorrhoids that do not prolapse). Impression:            - The examined portion of the ileum was normal.                        - Diverticulosis in the sigmoid colon.                        - Internal hemorrhoids.                        - No specimens collected. Recommendation:        - Repeat colonoscopy in 5 years for screening purposes.                        - Return to referring physician as previously                         scheduled.                        - Discharge patient to home.                        - Resume previous diet.                        - Continue present medications. Procedure Code(s):     --- Professional ---                        [phone], Colonoscopy, flexible; diagnostic, including  collection of specimen(s) by brushing or washing, when                         performed (separate procedure) Diagnosis Code(s):     --- Professional ---                        K64.0, First degree hemorrhoids                        K52.9, Noninfective gastroenteritis and colitis,                         unspecified                        K57.30, Diverticulosis of large intestine without                         perforation or abscess without bleeding CPT copyright 2022 American [employer] Association. All rights reserved. The codes documented in this report are preliminary and upon coder review may  be revised to meet current compliance requirements. Eather Colas MD, MD 09/10/2023 10:29:11 AM Number of Addenda: 0 Note Initiated On: 09/10/2023 9:47 AM Scope Withdrawal Time: 0 hours 4 minutes 7 seconds  Total Procedure Duration: 0 hours 10 minutes 54 seconds  Estimated Blood Loss:  Estimated blood loss: none.      [facility]

## 2023-09-10 NOTE — Interval H&P Note (Signed)
History and Physical Interval Note:  09/10/2023 9:50 AM  Zoe Suarez  has presented today for surgery, with the diagnosis of BOWEL HABIT CHANGES.  The various methods of treatment have been discussed with the patient and family. After consideration of risks, benefits and other options for treatment, the patient has consented to  Procedure(s): COLONOSCOPY WITH PROPOFOL (N/A) ESOPHAGOGASTRODUODENOSCOPY (EGD) WITH PROPOFOL (N/A) as a surgical intervention.  The patient's history has been reviewed, patient examined, no change in status, stable for surgery.  I have reviewed the patient's chart and labs.  Questions were answered to the patient's satisfaction.     Regis Bill  Ok to proceed with EGD/Colonoscopy

## 2023-09-10 NOTE — Op Note (Signed)
[facility] Gastroenterology Patient Name: Zoe Suarez Procedure Date: 09/10/2023 9:48 AM MRN: [id number] Account #: [id number] Date of Birth: 01-04-1956 Admit Type: Outpatient Age: 68 Room: [facility] ENDO ROOM 3 Gender: Female Note Status: Finalized Instrument Name: Upper Endoscope [id number] Procedure:             Upper GI endoscopy Indications:           Dyspepsia Providers:             Eather Colas MD, MD Referring MD:          Danella Penton, MD (Referring MD) Medicines:             Monitored Anesthesia Care Complications:         No immediate complications. Estimated blood loss:                         Minimal. Procedure:             Pre-Anesthesia Assessment:                        - Prior to the procedure, a History and Physical was                         performed, and patient medications and allergies were                         reviewed. The patient is competent. The risks and                         benefits of the procedure and the sedation options and                         risks were discussed with the patient. All questions                         were answered and informed consent was obtained.                         Patient identification and proposed procedure were                         verified by the physician, the nurse, the                         anesthesiologist, the anesthetist and the technician                         in the endoscopy suite. Mental Status Examination:                         alert and oriented. Airway Examination: normal                         oropharyngeal airway and neck mobility. Respiratory                         Examination: clear to auscultation. CV Examination:  normal. Prophylactic Antibiotics: The patient does not                         require prophylactic antibiotics. Prior                         Anticoagulants: The patient has taken no anticoagulant                         or  antiplatelet agents. ASA Grade Assessment: III - A                         patient with severe systemic disease. After reviewing                         the risks and benefits, the patient was deemed in                         satisfactory condition to undergo the procedure. The                         anesthesia plan was to use monitored anesthesia care                         (MAC). Immediately prior to administration of                         medications, the patient was re-assessed for adequacy                         to receive sedatives. The heart rate, respiratory                         rate, oxygen saturations, blood pressure, adequacy of                         pulmonary ventilation, and response to care were                         monitored throughout the procedure. The physical                         status of the patient was re-assessed after the                         procedure.                        After obtaining informed consent, the endoscope was                         passed under direct vision. Throughout the procedure,                         the patient's blood pressure, pulse, and oxygen                         saturations were monitored continuously. The Endoscope  was introduced through the mouth, and advanced to the                         second part of duodenum. The upper GI endoscopy was                         accomplished without difficulty. The patient tolerated                         the procedure well. Findings:      The examined esophagus was normal.      An 8 cm hiatal hernia was present.      Scattered mild inflammation characterized by erythema was found in the       gastric antrum. Biopsies were taken with a cold forceps for Helicobacter       pylori testing. Estimated blood loss was minimal.      The examined duodenum was normal. Impression:            - Normal esophagus.                        - 8 cm hiatal hernia.                         - Gastritis. Biopsied.                        - Normal examined duodenum. Recommendation:        - Discharge patient to home.                        - Resume previous diet.                        - Continue present medications.                        - Await pathology results.                        - Return to referring physician as previously                         scheduled. Procedure Code(s):     --- Professional ---                        [phone], Esophagogastroduodenoscopy, flexible,                         transoral; with biopsy, single or multiple Diagnosis Code(s):     --- Professional ---                        K44.9, Diaphragmatic hernia without obstruction or                         gangrene                        K29.70, Gastritis, unspecified, without bleeding  R10.13, Epigastric pain CPT copyright 2022 American Medical Association. All rights reserved. The codes documented in this report are preliminary and upon coder review may  be revised to meet current compliance requirements. Eather Colas MD, MD 09/10/2023 10:24:15 AM Number of Addenda: 0 Note Initiated On: 09/10/2023 9:48 AM Estimated Blood Loss:  Estimated blood loss was minimal.      [facility]

## 2023-09-11 NOTE — Anesthesia Postprocedure Evaluation (Signed)
 Anesthesia Post Note  Patient: BRICEIDA RASBERRY  Procedure(s) Performed: COLONOSCOPY WITH PROPOFOL  ESOPHAGOGASTRODUODENOSCOPY (EGD) WITH PROPOFOL  BIOPSY  Patient location during evaluation: Endoscopy Anesthesia Type: General Level of consciousness: awake and alert Pain management: pain level controlled Vital Signs Assessment: post-procedure vital signs reviewed and stable Respiratory status: spontaneous breathing, nonlabored ventilation, respiratory function stable and patient connected to nasal cannula oxygen Cardiovascular status: blood pressure returned to baseline and stable Postop Assessment: no apparent nausea or vomiting Anesthetic complications: no   No notable events documented.   Last Vitals:  Vitals:   09/10/23 1031 09/10/23 1053  BP: (!) 134/108 126/75  Pulse: (!) 108 86  Resp: 18 20  Temp:    SpO2: 97% 100%    Last Pain:  Vitals:   09/10/23 1053  TempSrc:   PainSc: 0-No pain                 Prentice Murphy

## 2023-09-12 ENCOUNTER — Encounter: Payer: Self-pay | Admitting: Gastroenterology

## 2023-09-12 LAB — SURGICAL PATHOLOGY

## 2023-11-29 ENCOUNTER — Ambulatory Visit: Admitting: Dermatology

## 2024-01-01 ENCOUNTER — Ambulatory Visit: Admitting: Dermatology

## 2024-02-05 ENCOUNTER — Encounter: Payer: Self-pay | Admitting: Dermatology

## 2024-02-05 ENCOUNTER — Ambulatory Visit: Admitting: Dermatology

## 2024-02-05 DIAGNOSIS — L820 Inflamed seborrheic keratosis: Secondary | ICD-10-CM

## 2024-02-05 DIAGNOSIS — Z79899 Other long term (current) drug therapy: Secondary | ICD-10-CM

## 2024-02-05 DIAGNOSIS — W908XXA Exposure to other nonionizing radiation, initial encounter: Secondary | ICD-10-CM

## 2024-02-05 DIAGNOSIS — L821 Other seborrheic keratosis: Secondary | ICD-10-CM

## 2024-02-05 DIAGNOSIS — Z1283 Encounter for screening for malignant neoplasm of skin: Secondary | ICD-10-CM | POA: Diagnosis not present

## 2024-02-05 DIAGNOSIS — L570 Actinic keratosis: Secondary | ICD-10-CM | POA: Diagnosis not present

## 2024-02-05 DIAGNOSIS — Z85828 Personal history of other malignant neoplasm of skin: Secondary | ICD-10-CM

## 2024-02-05 DIAGNOSIS — L814 Other melanin hyperpigmentation: Secondary | ICD-10-CM | POA: Diagnosis not present

## 2024-02-05 DIAGNOSIS — L578 Other skin changes due to chronic exposure to nonionizing radiation: Secondary | ICD-10-CM | POA: Diagnosis not present

## 2024-02-05 DIAGNOSIS — Z5111 Encounter for antineoplastic chemotherapy: Secondary | ICD-10-CM

## 2024-02-05 DIAGNOSIS — Z7189 Other specified counseling: Secondary | ICD-10-CM

## 2024-02-05 DIAGNOSIS — Z872 Personal history of diseases of the skin and subcutaneous tissue: Secondary | ICD-10-CM

## 2024-02-05 DIAGNOSIS — D229 Melanocytic nevi, unspecified: Secondary | ICD-10-CM

## 2024-02-05 DIAGNOSIS — D1801 Hemangioma of skin and subcutaneous tissue: Secondary | ICD-10-CM

## 2024-02-05 MED ORDER — FLUOROURACIL 5 % EX CREA: TOPICAL_CREAM | CUTANEOUS | 0 refills | Status: AC

## 2024-02-05 NOTE — Patient Instructions (Addendum)
 Instructions for [facility] Medications - starting September 1st apply to affected area of left cheek twice daily.  One or more of your medications was sent to the [facility] mail order compounding pharmacy. You will receive an email from them and can purchase the medicine through that link. It will then be mailed to your home at the address you confirmed. If for any reason you do not receive an email from them, please check your spam folder. If you still do not find the email, please let us  know. [facility] phone number is [phone].  Due to recent changes in healthcare laws, you may see results of your pathology and/or laboratory studies on MyChart before the doctors have had a chance to review them. We understand that in some cases there may be results that are confusing or concerning to you. Please understand that not all results are received at the same time and often the doctors may need to interpret multiple results in order to provide you with the best plan of care or course of treatment. Therefore, we ask that you please give us  2 business days to thoroughly review all your results before contacting the office for clarification. Should we see a critical lab result, you will be contacted sooner.   If You Need Anything After Your Visit  If you have any questions or concerns for your doctor, please call our main line at [phone] and press option 4 to reach your doctor's medical assistant. If no one answers, please leave a voicemail as directed and we will return your call as soon as possible. Messages left after 4 pm will be answered the following business day.   You may also send us  a message via MyChart. We typically respond to MyChart messages within 1-2 business days.  For prescription refills, please ask your pharmacy to contact our office. Our fax number is [phone].  If you have an urgent issue when the clinic is closed that cannot wait until the next business  day, you can page your doctor at the number below.    Please note that while we do our best to be available for urgent issues outside of office hours, we are not available 24/7.   If you have an urgent issue and are unable to reach us , you may choose to seek medical care at your doctor's office, retail clinic, urgent care center, or emergency room.  If you have a medical emergency, please immediately call 911 or go to the emergency department.  Pager Numbers  - Dr. Hester: [phone]  - Dr. Jackquline: [phone]  - Dr. Claudene: [phone]   In the event of inclement weather, please call our main line at [phone] for an update on the status of any delays or closures.  Dermatology Medication Tips: Please keep the boxes that topical medications come in in order to help keep track of the instructions about where and how to use these. Pharmacies typically print the medication instructions only on the boxes and not directly on the medication tubes.   If your medication is too expensive, please contact our office at [phone] option 4 or send us  a message through MyChart.   We are unable to tell what your co-pay for medications will be in advance as this is different depending on your insurance coverage. However, we may be able to find a substitute medication at lower cost or fill out paperwork to get insurance to cover a needed medication.   If a prior authorization is  required to get your medication covered by your insurance company, please allow us  1-2 business days to complete this process.  Drug prices often vary depending on where the prescription is filled and some pharmacies may offer cheaper prices.  The website www.goodrx.com contains coupons for medications through different pharmacies. The prices here do not account for what the cost may be with help from insurance (it may be cheaper with your insurance), but the website can give you the price if you did not use any  insurance.  - You can print the associated coupon and take it with your prescription to the pharmacy.  - You may also stop by our office during regular business hours and pick up a GoodRx coupon card.  - If you need your prescription sent electronically to a different pharmacy, notify our office through [facility] or by phone at [phone] option 4.     Si Usted Necesita Algo Despus de Su Visita  Tambin puede enviarnos un mensaje a travs de Clinical cytogeneticist. Por lo general respondemos a los mensajes de MyChart en el transcurso de 1 a 2 das hbiles.  Para renovar recetas, por favor pida a su farmacia que se ponga en contacto con nuestra oficina. Randi lakes de fax es Kevin [phone].  Si tiene un asunto urgente cuando la clnica est cerrada y que no puede esperar hasta el siguiente da hbil, puede llamar/localizar a su doctor(a) al nmero que aparece a continuacin.   Por favor, tenga en cuenta que aunque hacemos todo lo posible para estar disponibles para asuntos urgentes fuera del horario de [place], no estamos disponibles las 24 horas del da, los 7 [address]  [address] de la [place].   Si tiene un problema urgente y no puede comunicarse con nosotros, puede optar por buscar atencin mdica  en el consultorio de su doctor(a), en una clnica privada, en un centro de atencin urgente o en una sala de emergencias.  Si tiene Engineer, drilling, por favor llame inmediatamente al 911 o vaya a la sala de emergencias.  Nmeros de bper  - Dr. Hester: [phone]  - Dra. Jackquline: [phone]  - Dr. Claudene: [phone]   En caso de inclemencias del tiempo, por favor llame a landry capes principal al [phone] para una actualizacin sobre el [place] de cualquier retraso o cierre.  Consejos para la medicacin en dermatologa: Por favor, guarde las cajas en las que vienen los medicamentos de uso tpico para ayudarle a seguir las instrucciones sobre dnde y cmo usarlos. Las farmacias  generalmente imprimen las instrucciones del medicamento slo en las cajas y no directamente en los tubos del [place].   Si su medicamento es muy caro, por favor, pngase en contacto con landry rieger llamando al [phone] y presione la opcin 4 o envenos un mensaje a travs de Clinical cytogeneticist.   No podemos decirle cul ser su copago por los medicamentos por adelantado ya que esto es diferente dependiendo de la cobertura de su seguro. Sin embargo, es posible que podamos encontrar un medicamento sustituto a Audiological scientist un formulario para que el seguro cubra el medicamento que se considera necesario.   Si se requiere una autorizacin previa para que su compaa de seguros malta su medicamento, por favor permtanos de 1 a 2 das hbiles para completar este proceso.  Los precios de los medicamentos varan con frecuencia dependiendo del Environmental consultant de dnde se surte la receta y alguna farmacias pueden ofrecer precios ms baratos.  El sitio web www.goodrx.com tiene cupones para medicamentos  de [employer]. Los precios aqu no tienen en cuenta lo que podra costar con la ayuda del seguro (puede ser ms barato con su seguro), pero el sitio web puede darle el precio si no utiliz Tourist information centre manager.  - Puede imprimir el cupn correspondiente y llevarlo con su receta a la farmacia.  - Tambin puede pasar por nuestra oficina durante el horario de atencin regular y Education officer, museum una tarjeta de cupones de GoodRx.  - Si necesita que su receta se enve electrnicamente a una farmacia diferente, informe a nuestra oficina a travs de MyChart de [place] o por telfono llamando al [phone] y presione la opcin 4.

## 2024-02-05 NOTE — Progress Notes (Signed)
 Follow-Up Visit   Subjective  Zoe Suarez is a 68 y.o. female who presents for the following: Skin Cancer Screening and Full Body Skin Exam  The patient presents for Total-Body Skin Exam (TBSE) for skin cancer screening and mole check. The patient has spots, moles and lesions to be evaluated, some may be new or changing and the patient may have concern these could be cancer.  The following portions of the chart were reviewed this encounter and updated as appropriate: medications, allergies, medical history  Review of Systems:  No other skin or systemic complaints except as noted in HPI or Assessment and Plan.  Objective  Well appearing patient in no apparent distress; mood and affect are within normal limits.  A full examination was performed including scalp, head, eyes, ears, nose, lips, neck, chest, axillae, abdomen, back, buttocks, bilateral upper extremities, bilateral lower extremities, hands, feet, fingers, toes, fingernails, and toenails. All findings within normal limits unless otherwise noted below.   Relevant physical exam findings are noted in the Assessment and Plan.  chest x 3, L cheek x 1 (4) Erythematous thin papules/macules with gritty scale.   Back x 15, L side x 6, L clavicle x 1 (21) Erythematous stuck-on, waxy papule or plaque  Assessment & Plan   SKIN CANCER SCREENING PERFORMED TODAY.  ACTINIC DAMAGE - Chronic condition, secondary to cumulative UV/sun exposure - diffuse scaly erythematous macules with underlying dyspigmentation - Recommend daily broad spectrum sunscreen SPF 30+ to sun-exposed areas, reapply every 2 hours as needed.  - Staying in the shade or wearing long sleeves, sun glasses (UVA+UVB protection) and wide brim hats (4-inch brim around the entire circumference of the hat) are also recommended for sun protection.  - Call for new or changing lesions.  LENTIGINES, SEBORRHEIC KERATOSES, HEMANGIOMAS - Benign normal skin lesions -  Benign-appearing - Call for any changes  MELANOCYTIC NEVI - Tan-brown and/or pink-flesh-colored symmetric macules and papules - Benign appearing on exam today - Observation - Call clinic for new or changing moles - Recommend daily use of broad spectrum spf 30+ sunscreen to sun-exposed areas.   HISTORY OF BASAL CELL CARCINOMA OF THE SKIN - No evidence of recurrence today - Recommend regular full body skin exams - Recommend daily broad spectrum sunscreen SPF 30+ to sun-exposed areas, reapply every 2 hours as needed.  - Call if any new or changing lesions are noted between office visits  Hx of URTICARIA Exam: Clear. Chronic condition with duration or expected duration over one year. Currently well-controlled. Urticaria or hives is a pink to red patchy whelp- like rash of the skin that typically itches and it is the result of histamine release in the skin.   Hives may have multiple causes including stress, medications, infections, and systemic illness.  Sometimes there is a family history of chronic urticaria.   Physical urticarias may be caused by pressure (dermatographism), heat, sun, cold, vibration.  Insect bites can cause papular urticaria. It is often difficult to find the cause of generalized hives.  Statistically, 70% of the time a cause of generalized hives is not found.  Sometimes hives can spontaneously resolve. Other times hives can persist and when it does, and no cause is found, and it has been at least 6 weeks since started, it is called chronic idiopathic urticaria. Antihistamines are the mainstay for treatment.  In severe cases Xolair  injections may be used.  Treatment Plan: Continue antihistamines daily and OTC cream.   AK (ACTINIC KERATOSIS) (4) chest x  3, L cheek x 1 (4) Actinic keratoses are precancerous spots that appear secondary to cumulative UV radiation exposure/sun exposure over time. They are chronic with expected duration over 1 year. A portion of actinic  keratoses will progress to squamous cell carcinoma of the skin. It is not possible to reliably predict which spots will progress to skin cancer and so treatment is recommended to prevent development of skin cancer.  Recommend daily broad spectrum sunscreen SPF 30+ to sun-exposed areas, reapply every 2 hours as needed.  Recommend staying in the shade or wearing long sleeves, sun glasses (UVA+UVB protection) and wide brim hats (4-inch brim around the entire circumference of the hat). Call for new or changing lesions.  ACTINIC DAMAGE WITH PRECANCEROUS ACTINIC KERATOSES Counseling for Topical Chemotherapy Management: Patient exhibits: - Severe, confluent actinic changes with pre-cancerous actinic keratoses that is secondary to cumulative UV radiation exposure over time - Condition that is severe; chronic, not at goal. - diffuse scaly erythematous macules and papules with underlying dyspigmentation - Discussed Prescription Field Treatment topical Chemotherapy for Severe, Chronic Confluent Actinic Changes with Pre-Cancerous Actinic Keratoses Field treatment involves treatment of an entire area of skin that has confluent Actinic Changes (Sun/ Ultraviolet light damage) and PreCancerous Actinic Keratoses by method of PhotoDynamic Therapy (PDT) and/or prescription Topical Chemotherapy agents such as 5-fluorouracil, 5-fluorouracil/calcipotriene, and/or imiquimod.  The purpose is to decrease the number of clinically evident and subclinical PreCancerous lesions to prevent progression to development of skin cancer by chemically destroying early precancer changes that may or may not be visible.  It has been shown to reduce the risk of developing skin cancer in the treated area. As a result of treatment, redness, scaling, crusting, and open sores may occur during treatment course. One or more than one of these methods may be used and may have to be used several times to control, suppress and eliminate the  PreCancerous changes. Discussed treatment course, expected reaction, and possible side effects. - Recommend daily broad spectrum sunscreen SPF 30+ to sun-exposed areas, reapply every 2 hours as needed.  - Staying in the shade or wearing long sleeves, sun glasses (UVA+UVB protection) and wide brim hats (4-inch brim around the entire circumference of the hat) are also recommended. - Call for new or changing lesions. - Starting September 1st apply to aa L cheek BID x 7 days.  Destruction of lesion - chest x 3, L cheek x 1 (4) Complexity: simple   Destruction method: cryotherapy   Informed consent: discussed and consent obtained   Timeout:  patient name, date of birth, surgical site, and procedure verified Lesion destroyed using liquid nitrogen: Yes   Region frozen until ice ball extended beyond lesion: Yes   Outcome: patient tolerated procedure well with no complications   Post-procedure details: wound care instructions given   INFLAMED SEBORRHEIC KERATOSIS (21) Back x 15, L side x 6, L clavicle x 1 (21) Symptomatic, irritating, patient would like treated.  Destruction of lesion - Back x 15, L side x 6, L clavicle x 1 (21) Complexity: simple   Destruction method: cryotherapy   Informed consent: discussed and consent obtained   Timeout:  patient name, date of birth, surgical site, and procedure verified Lesion destroyed using liquid nitrogen: Yes   Region frozen until ice ball extended beyond lesion: Yes   Outcome: patient tolerated procedure well with no complications   Post-procedure details: wound care instructions given    Return in about 1 year (around 02/04/2025) for TBSE - hx BCC;  6 mth AK recheck.  LILLETTE Rosina Mayans, CMA, am acting as scribe for Alm Rhyme, MD .   Documentation: I have reviewed the above documentation for accuracy and completeness, and I agree with the above.  Alm Rhyme, MD

## 2024-03-17 ENCOUNTER — Telehealth: Payer: Self-pay

## 2024-03-17 NOTE — Telephone Encounter (Signed)
 Patient calling asking if she need to start chemo cream sept 1  Discussed with patient in her chart Dr MARLA prescribed the chemo cream- start Sept 1 on her left cheek

## 2024-08-12 ENCOUNTER — Ambulatory Visit: Admitting: Dermatology

## 2024-08-12 ENCOUNTER — Encounter: Payer: Self-pay | Admitting: Dermatology

## 2024-08-12 DIAGNOSIS — L820 Inflamed seborrheic keratosis: Secondary | ICD-10-CM

## 2024-08-12 DIAGNOSIS — L821 Other seborrheic keratosis: Secondary | ICD-10-CM | POA: Diagnosis not present

## 2024-08-12 DIAGNOSIS — Z872 Personal history of diseases of the skin and subcutaneous tissue: Secondary | ICD-10-CM

## 2024-08-12 DIAGNOSIS — Z85828 Personal history of other malignant neoplasm of skin: Secondary | ICD-10-CM | POA: Diagnosis not present

## 2024-08-12 DIAGNOSIS — L578 Other skin changes due to chronic exposure to nonionizing radiation: Secondary | ICD-10-CM

## 2024-08-12 DIAGNOSIS — W908XXA Exposure to other nonionizing radiation, initial encounter: Secondary | ICD-10-CM

## 2024-08-12 NOTE — Progress Notes (Signed)
 "  Follow-Up Visit   Subjective  LILLIE PORTNER is a 69 y.o. female who presents for the following: 6 months f/u on precancers on her face and chest. Treated her face with with 5FU/Calcipotriene cream ~ 4 months ago with a good response.  Check a spot on her right breast started out as a brown spot, treated with 5FU/Calcipotriene cream top layer fell off area still not gone away.   The following portions of the chart were reviewed this encounter and updated as appropriate: medications, allergies, medical history  Review of Systems:  No other skin or systemic complaints except as noted in HPI or Assessment and Plan.  Objective  Well appearing patient in no apparent distress; mood and affect are within normal limits.  A focused examination was performed of the following areas: Face,chest  Relevant exam findings are noted in the Assessment and Plan.  left hand x 1, right breast superior to areola x 1 (2) Stuck-on, waxy, tan-brown papule or plaque --Discussed benign etiology and prognosis.   Assessment & Plan  INFLAMED SEBORRHEIC KERATOSIS (2) left hand x 1, right breast superior to areola x 1 (2) Symptomatic, irritating, patient would like treated.   If area treated in the right breast is not gone in 8 weeks return to the office  - Destruction of lesion - left hand x 1, right breast superior to areola x 1 (2) Complexity: simple   Destruction method: cryotherapy   Informed consent: discussed and consent obtained   Timeout:  patient name, date of birth, surgical site, and procedure verified Lesion destroyed using liquid nitrogen: Yes   Region frozen until ice ball extended beyond lesion: Yes   Outcome: patient tolerated procedure well with no complications   Post-procedure details: wound care instructions given    ACTINIC SKIN DAMAGE   SEBORRHEIC KERATOSIS   HISTORY OF BASAL CELL CARCINOMA    SEBORRHEIC KERATOSIS - Stuck-on, waxy, tan-brown papules and/or plaques  -  Benign-appearing - Discussed benign etiology and prognosis. - Observe - Call for any changes   HISTORY OF PRECANCEROUS ACTINIC KERATOSIS Left cheek treated 5-FU/calcipotriene-currently clear today.  - these may recur and new lesions may form requiring treatment to prevent transformation into skin cancer - observe for new or changing spots and contact [facility] for appointment if occur - photoprotection with sun protective clothing; sunglasses and broad spectrum sunscreen with SPF of at least 30 + and frequent self skin exams recommended - yearly exams by a dermatologist recommended for persons with history of PreCancerous Actinic Keratoses    ACTINIC DAMAGE - chronic, secondary to cumulative UV radiation exposure/sun exposure over time - diffuse scaly erythematous macules with underlying dyspigmentation - Recommend daily broad spectrum sunscreen SPF 30+ to sun-exposed areas, reapply every 2 hours as needed.  - Recommend staying in the shade or wearing long sleeves, sun glasses (UVA+UVB protection) and wide brim hats (4-inch brim around the entire circumference of the hat). - Call for new or changing lesions.   HISTORY OF BASAL CELL CARCINOMA OF THE SKIN - No evidence of recurrence today - Recommend regular full body skin exams - Recommend daily broad spectrum sunscreen SPF 30+ to sun-exposed areas, reapply every 2 hours as needed.  - Call if any new or changing lesions are noted between office visits   Return for scheduled TBSE appt 02/04/2025 .  LILLETTE Fay Kirks, CMA, am acting as scribe for Alm Rhyme, MD .   Documentation: I have reviewed the above documentation for  accuracy and completeness, and I agree with the above.  Alm Rhyme, MD    "

## 2024-08-12 NOTE — Patient Instructions (Addendum)

## 2025-02-04 ENCOUNTER — Encounter: Admitting: Dermatology
